# Patient Record
Sex: Female | Born: 1960
Health system: Southern US, Community
[De-identification: ages and names within clinical notes are randomized; demographics above are authoritative.]

## PROBLEM LIST (undated history)

## (undated) DIAGNOSIS — Z8639 Personal history of other endocrine, nutritional and metabolic disease: Secondary | ICD-10-CM

## (undated) DIAGNOSIS — J343 Hypertrophy of nasal turbinates: Secondary | ICD-10-CM

## (undated) DIAGNOSIS — T4145XA Adverse effect of unspecified anesthetic, initial encounter: Secondary | ICD-10-CM

## (undated) DIAGNOSIS — J342 Deviated nasal septum: Secondary | ICD-10-CM

## (undated) DIAGNOSIS — T8859XA Other complications of anesthesia, initial encounter: Secondary | ICD-10-CM

## (undated) HISTORY — PX: ENDOMETRIAL ABLATION: SHX621

## (undated) HISTORY — PX: TUBAL LIGATION: SHX77

## (undated) HISTORY — PX: PILONIDAL CYST EXCISION: SHX744

---

## 2008-10-06 ENCOUNTER — Ambulatory Visit: Payer: Self-pay | Admitting: Cardiology

## 2010-07-13 ENCOUNTER — Ambulatory Visit: Payer: Self-pay | Admitting: Gastroenterology

## 2010-07-13 DIAGNOSIS — K828 Other specified diseases of gallbladder: Secondary | ICD-10-CM | POA: Insufficient documentation

## 2010-07-13 DIAGNOSIS — R109 Unspecified abdominal pain: Secondary | ICD-10-CM | POA: Insufficient documentation

## 2010-07-16 LAB — CONVERTED CEMR LAB: Tissue Transglutaminase Ab, IgA: 10.4 units (ref <20)

## 2010-09-02 ENCOUNTER — Encounter: Admission: RE | Admit: 2010-09-02 | Discharge: 2010-09-02 | Payer: Self-pay | Admitting: Obstetrics and Gynecology

## 2010-09-22 ENCOUNTER — Ambulatory Visit (HOSPITAL_COMMUNITY): Admission: RE | Admit: 2010-09-22 | Discharge: 2010-09-22 | Payer: Self-pay | Admitting: General Surgery

## 2010-09-29 ENCOUNTER — Encounter (INDEPENDENT_AMBULATORY_CARE_PROVIDER_SITE_OTHER): Payer: Self-pay | Admitting: *Deleted

## 2010-12-18 ENCOUNTER — Encounter: Payer: Self-pay | Admitting: General Surgery

## 2010-12-28 NOTE — Letter (Signed)
Summary: Recall Office Visit  [facility]  [address]   [place], [place] [address]   Phone: [phone]  Fax: [phone]      September 29, 2010   Amy Irwin [address] [place], [place]  [address] [birth year]   Dear [name],   According to our records, it is time for you to schedule a follow-up office visit with [place].   At your convenience, please call [phone] to schedule an office visit. If you have any questions, concerns, or feel that this letter is in error, we would appreciate your call.   Sincerely,    Diana Eves  [facility] Ph: [phone]   Fax: [phone]

## 2010-12-30 NOTE — Assessment & Plan Note (Signed)
Summary: ABD PAIN, GB EF 10%   Visit Type:  Initial Consult Referring Amy Irwin:  Roma Kayser, NP Primary Care Rashell Shambaugh:  Donzetta Sprung, M.D.  Chief Complaint:  nausea/abd pain.  History of Present Illness: Side hurts all the time on LUQ/LLQ. Dull and sometimes sharp and won't go away. Off and on. precipitating factors: ???. Under a lot of stress. No changes in stool associated with pain. If "empty colon and bladder it's better". Mild nausea. No problems swallowing, constipation, weight loss, blood in stool or black stool. Diarrhea with fresh fruit esp. Feels bloated. Appetite fine. Pain can keep her awake. Dicyclomine helps. On Cymbalta for 2-3 years. tried to stop it but was on edge. TCS: 2006-[facility]. Rare cheese, often yogurt, milk with cereal every AM, and frequent ice cream. Uses Tylenol. No ASA, BC, Goodys, or Ibuprofen, Motrin, or Aleve.  Preventive Screening-Counseling & Management  Alcohol-Tobacco     Smoking Status: quit  Current Medications (verified): 1)  Glucosamine .... Take 1 Tablet By Mouth Once A Day 2)  Fish Oil 1000mg  .... Take 1 Tablet By Mouth Two Times A Day 3)  Astelin 137 Mcg/spray Soln (Azelastine Hcl) .... As Directed 4)  Allegra 60 Mg Tabs (Fexofenadine Hcl) .... Take 1 Tablet By Mouth Once A Day 5)  Saline Nasal Spray .... As Directed 6)  Dicyclomine Hcl 10 Mg Caps (Dicyclomine Hcl) .... Take 1 Tablet By Mouth Three Times A Day 7)  Cymbalta 60 Mg Cpep (Duloxetine Hcl) .... Take 1 Tab By Mouth At Bedtime 8)  Meclizine Hcl 25 Mg Tabs (Meclizine Hcl) .... As Needed  Allergies (verified): No Known Drug Allergies  Past History:  Past Medical History: Depression Allergies IBS '06-lost 11 lbs **2006-TCS: normal, [facility] 50/5  Past Surgical History: Mesh in bladder Tubal Ligation Endometrial Ablation  Family History: No FH of Colon Cancer or polyps Nephew with Crohn's GERD in bro and sis, s/p Nissen  Social History: Occupation: [employer] Divorced 2  kids, youngest 12 yo Patient is a former smoker x14 years. Quit in 1990. Alcohol Use - no Smoking Status:  quit  Review of Systems       Per HPI otherwise all systems negative.  Vital Signs:  Patient profile:   50 year old female Height:      61.5 inches Weight:      121 pounds BMI:     22.57 Temp:     98.6 degrees F oral Pulse rate:   80 / minute BP sitting:   100 / 70  (left arm) Cuff size:   regular  Vitals Entered By: Cloria Spring LPN (July 13, 2010 9:10 AM)  Physical Exam  General:  Well developed, well nourished, no acute distress. Head:  Normocephalic and atraumatic. Eyes:  PERRLA, no icterus. Mouth:  No deformity or lesions, dentition normal. Neck:  Supple; no masses. Lungs:  Clear throughout to auscultation. Heart:  Regular rate and rhythm; no murmurs. Abdomen:  Soft, nontender and nondistended. No masses,  or hernias noted. Normal bowel sounds. Extremities:  No edema or deformities noted. Neurologic:  Alert and  oriented x4;  grossly normal neurologically.  Impression & Recommendations:  Problem # 1:  ABDOMINAL PAIN, RECURRENT (ICD-789.00) Avoid dairy. Continue dicyclomine three times a day. OPV in 3 mos. Check TTG IGA. Probiotics daily: Restora  #5 sample pills and Rx given.  Problem # 2:  BILIARY DYSKINESIA (ICD-575.8) Assessment: Comment Only GB EF 10%. Pt Asx. No need for surgery at this time.  CC: PCP  Other Orders: T-Tissue Transglutamase Ab IgA [phone])  Patient Instructions: 1)  Avoid dairy. SEE HO. 2)  Continue dicyclomine three times a day. 3)  Take Probiotics daily. 4)  Continue Cymbalta. 5)  OPV in 3 mos. 6)  The medication list was reviewed and reconciled.  All changed / newly prescribed medications were explained.  A complete medication list was provided to the patient / caregiver.  Appended Document: ABD PAIN, GB EF 10% MAR 2011: 121 LBS  JUNE 2011 [facility]: [facility] GB EF 10%  CT AP w/o-CYST IN SPLEEN & LIVER, NO GALLSTONES, CR 0.68  TBILI 0.4 AST 12 ALT 5 ALK PHOS 49 HB 12.8 WBC 7.1 PLT 305 LIP 32  Appended Document: ABD PAIN, GB EF 10% 3 MONTH F/U APPOINTMENT IS IN THE COMPUTER/LAW  Appended Document: ABD PAIN, GB EF 10% 3 MONTH F/U OV IS IN THE COMPUTER/LAW  Appended Document: Orders Update    Clinical Lists Changes  Orders: Added new Service order of New Patient Level IV (09811) - Signed      Appended Document: ABD PAIN, GB EF 10% NOV 2010 TSH 1.260

## 2011-02-09 LAB — COMPREHENSIVE METABOLIC PANEL
ALT: 11 U/L (ref 0–35)
AST: 16 U/L (ref 0–37)
Alkaline Phosphatase: 55 U/L (ref 39–117)
CO2: 29 mEq/L (ref 19–32)
Calcium: 9.7 mg/dL (ref 8.4–10.5)
GFR calc Af Amer: >60 mL/min (ref >60)
Glucose, Bld: 87 mg/dL (ref 70–99)
Potassium: 4.5 mEq/L (ref 3.5–5.1)
Sodium: 138 mEq/L (ref 135–145)
Total Protein: 6.9 g/dL (ref 6.0–8.3)

## 2011-02-09 LAB — CBC
HCT: 39.6 % (ref 36.0–46.0)
Hemoglobin: 13.1 g/dL (ref 12.0–15.0)
MCHC: 33.1 g/dL (ref 30.0–36.0)
RDW: 12.7 % (ref 11.5–15.5)
WBC: 7.3 10*3/uL (ref 4.0–10.5)

## 2011-02-09 LAB — DIFFERENTIAL
Basophils Relative: 0 % (ref 0–1)
Eosinophils Absolute: 0.1 10*3/uL (ref 0.0–0.7)
Eosinophils Relative: 1 % (ref 0–5)
Lymphs Abs: 1.8 10*3/uL (ref 0.7–4.0)
Monocytes Relative: 8 % (ref 3–12)

## 2012-07-26 ENCOUNTER — Other Ambulatory Visit (HOSPITAL_COMMUNITY): Payer: Self-pay | Admitting: Family Medicine

## 2012-07-26 ENCOUNTER — Encounter (HOSPITAL_COMMUNITY): Payer: Self-pay

## 2012-07-26 ENCOUNTER — Ambulatory Visit (HOSPITAL_COMMUNITY)
Admission: RE | Admit: 2012-07-26 | Discharge: 2012-07-26 | Disposition: A | Discharge status: Home / Self Care | Payer: BC Managed Care – PPO | Source: Ambulatory Visit | Attending: Family Medicine | Admitting: Family Medicine

## 2012-07-26 DIAGNOSIS — R1032 Left lower quadrant pain: Secondary | ICD-10-CM

## 2012-07-26 DIAGNOSIS — R9389 Abnormal findings on diagnostic imaging of other specified body structures: Secondary | ICD-10-CM | POA: Insufficient documentation

## 2012-07-26 DIAGNOSIS — N859 Noninflammatory disorder of uterus, unspecified: Secondary | ICD-10-CM | POA: Insufficient documentation

## 2012-07-26 MED ORDER — IOHEXOL 300 MG/ML  SOLN
100.0000 mL | Freq: Once | INTRAMUSCULAR | Status: AC | PRN
  Administered 2012-07-26: 100 mL via INTRAVENOUS

## 2012-09-26 ENCOUNTER — Other Ambulatory Visit: Payer: Self-pay | Admitting: Unknown Physician Specialty

## 2012-09-26 DIAGNOSIS — R928 Other abnormal and inconclusive findings on diagnostic imaging of breast: Secondary | ICD-10-CM

## 2012-10-03 ENCOUNTER — Other Ambulatory Visit: Payer: BC Managed Care – PPO

## 2012-10-04 ENCOUNTER — Ambulatory Visit
Admission: RE | Admit: 2012-10-04 | Discharge: 2012-10-04 | Disposition: A | Discharge status: Home / Self Care | Payer: BC Managed Care – PPO | Source: Ambulatory Visit | Attending: Unknown Physician Specialty | Admitting: Unknown Physician Specialty

## 2012-10-04 DIAGNOSIS — R928 Other abnormal and inconclusive findings on diagnostic imaging of breast: Secondary | ICD-10-CM

## 2012-11-28 HISTORY — PX: ABDOMINAL HYSTERECTOMY: SHX81

## 2013-09-09 ENCOUNTER — Other Ambulatory Visit: Payer: Self-pay

## 2013-09-09 DIAGNOSIS — Z803 Family history of malignant neoplasm of breast: Secondary | ICD-10-CM

## 2013-09-09 DIAGNOSIS — Z1231 Encounter for screening mammogram for malignant neoplasm of breast: Secondary | ICD-10-CM

## 2013-10-10 ENCOUNTER — Ambulatory Visit: Payer: BC Managed Care – PPO

## 2013-11-11 ENCOUNTER — Ambulatory Visit
Admission: RE | Admit: 2013-11-11 | Discharge: 2013-11-11 | Disposition: A | Discharge status: Home / Self Care | Payer: BC Managed Care – PPO | Source: Ambulatory Visit

## 2013-11-11 DIAGNOSIS — Z803 Family history of malignant neoplasm of breast: Secondary | ICD-10-CM

## 2013-11-11 DIAGNOSIS — Z1231 Encounter for screening mammogram for malignant neoplasm of breast: Secondary | ICD-10-CM

## 2013-11-12 ENCOUNTER — Other Ambulatory Visit (HOSPITAL_COMMUNITY): Payer: Self-pay | Admitting: Otolaryngology

## 2013-11-12 ENCOUNTER — Ambulatory Visit (HOSPITAL_COMMUNITY)
Admission: RE | Admit: 2013-11-12 | Discharge: 2013-11-12 | Disposition: A | Discharge status: Home / Self Care | Payer: BC Managed Care – PPO | Source: Ambulatory Visit | Attending: Otolaryngology | Admitting: Otolaryngology

## 2013-11-12 ENCOUNTER — Ambulatory Visit (HOSPITAL_COMMUNITY)
Admission: AD | Admit: 2013-11-12 | Discharge: 2013-11-12 | Disposition: A | Discharge status: Home / Self Care | Payer: BC Managed Care – PPO | Attending: Otolaryngology | Admitting: Otolaryngology

## 2013-11-12 DIAGNOSIS — J329 Chronic sinusitis, unspecified: Secondary | ICD-10-CM

## 2014-11-04 ENCOUNTER — Other Ambulatory Visit: Payer: Self-pay

## 2014-11-04 DIAGNOSIS — Z1231 Encounter for screening mammogram for malignant neoplasm of breast: Secondary | ICD-10-CM

## 2014-11-12 ENCOUNTER — Ambulatory Visit
Admission: RE | Admit: 2014-11-12 | Discharge: 2014-11-12 | Disposition: A | Discharge status: Home / Self Care | Payer: 59 | Source: Ambulatory Visit

## 2014-11-12 ENCOUNTER — Encounter (INDEPENDENT_AMBULATORY_CARE_PROVIDER_SITE_OTHER): Payer: Self-pay

## 2014-11-12 DIAGNOSIS — Z1231 Encounter for screening mammogram for malignant neoplasm of breast: Secondary | ICD-10-CM

## 2015-04-16 ENCOUNTER — Ambulatory Visit (INDEPENDENT_AMBULATORY_CARE_PROVIDER_SITE_OTHER): Payer: BLUE CROSS/BLUE SHIELD | Admitting: Otolaryngology

## 2015-04-16 DIAGNOSIS — R040 Epistaxis: Secondary | ICD-10-CM | POA: Diagnosis not present

## 2015-04-16 DIAGNOSIS — J343 Hypertrophy of nasal turbinates: Secondary | ICD-10-CM | POA: Diagnosis not present

## 2015-04-16 DIAGNOSIS — J342 Deviated nasal septum: Secondary | ICD-10-CM | POA: Diagnosis not present

## 2015-05-14 ENCOUNTER — Ambulatory Visit (INDEPENDENT_AMBULATORY_CARE_PROVIDER_SITE_OTHER): Payer: BLUE CROSS/BLUE SHIELD | Admitting: Otolaryngology

## 2015-05-14 DIAGNOSIS — R040 Epistaxis: Secondary | ICD-10-CM | POA: Diagnosis not present

## 2015-08-06 ENCOUNTER — Ambulatory Visit (INDEPENDENT_AMBULATORY_CARE_PROVIDER_SITE_OTHER): Payer: BLUE CROSS/BLUE SHIELD | Admitting: Otolaryngology

## 2015-08-06 DIAGNOSIS — J310 Chronic rhinitis: Secondary | ICD-10-CM

## 2015-08-06 DIAGNOSIS — J342 Deviated nasal septum: Secondary | ICD-10-CM | POA: Diagnosis not present

## 2015-08-06 DIAGNOSIS — J343 Hypertrophy of nasal turbinates: Secondary | ICD-10-CM | POA: Diagnosis not present

## 2015-08-29 DIAGNOSIS — J342 Deviated nasal septum: Secondary | ICD-10-CM

## 2015-08-29 DIAGNOSIS — J343 Hypertrophy of nasal turbinates: Secondary | ICD-10-CM

## 2015-08-29 HISTORY — DX: Deviated nasal septum: J34.2

## 2015-08-29 HISTORY — DX: Hypertrophy of nasal turbinates: J34.3

## 2015-09-10 ENCOUNTER — Ambulatory Visit (INDEPENDENT_AMBULATORY_CARE_PROVIDER_SITE_OTHER): Payer: BLUE CROSS/BLUE SHIELD | Admitting: Otolaryngology

## 2015-09-10 DIAGNOSIS — J342 Deviated nasal septum: Secondary | ICD-10-CM

## 2015-09-10 DIAGNOSIS — J310 Chronic rhinitis: Secondary | ICD-10-CM

## 2015-09-10 DIAGNOSIS — R040 Epistaxis: Secondary | ICD-10-CM | POA: Diagnosis not present

## 2015-09-10 DIAGNOSIS — J343 Hypertrophy of nasal turbinates: Secondary | ICD-10-CM | POA: Diagnosis not present

## 2015-09-21 ENCOUNTER — Other Ambulatory Visit: Payer: Self-pay | Admitting: Otolaryngology

## 2015-09-24 ENCOUNTER — Encounter (HOSPITAL_BASED_OUTPATIENT_CLINIC_OR_DEPARTMENT_OTHER): Payer: Self-pay | Admitting: *Deleted

## 2015-09-29 ENCOUNTER — Ambulatory Visit (HOSPITAL_BASED_OUTPATIENT_CLINIC_OR_DEPARTMENT_OTHER): Payer: BLUE CROSS/BLUE SHIELD | Admitting: Anesthesiology

## 2015-09-29 ENCOUNTER — Encounter (HOSPITAL_BASED_OUTPATIENT_CLINIC_OR_DEPARTMENT_OTHER)
Admission: RE | Disposition: A | Discharge status: Home / Self Care | Payer: Self-pay | Source: Ambulatory Visit | Attending: Otolaryngology

## 2015-09-29 ENCOUNTER — Ambulatory Visit (HOSPITAL_BASED_OUTPATIENT_CLINIC_OR_DEPARTMENT_OTHER)
Admission: RE | Admit: 2015-09-29 | Discharge: 2015-09-29 | Disposition: A | Discharge status: Home / Self Care | Payer: BLUE CROSS/BLUE SHIELD | Source: Ambulatory Visit | Attending: Otolaryngology | Admitting: Otolaryngology

## 2015-09-29 ENCOUNTER — Encounter (HOSPITAL_BASED_OUTPATIENT_CLINIC_OR_DEPARTMENT_OTHER): Payer: Self-pay | Admitting: *Deleted

## 2015-09-29 DIAGNOSIS — R040 Epistaxis: Secondary | ICD-10-CM | POA: Diagnosis not present

## 2015-09-29 DIAGNOSIS — J342 Deviated nasal septum: Secondary | ICD-10-CM

## 2015-09-29 DIAGNOSIS — J343 Hypertrophy of nasal turbinates: Secondary | ICD-10-CM | POA: Insufficient documentation

## 2015-09-29 DIAGNOSIS — J3489 Other specified disorders of nose and nasal sinuses: Secondary | ICD-10-CM | POA: Insufficient documentation

## 2015-09-29 HISTORY — DX: Hypertrophy of nasal turbinates: J34.3

## 2015-09-29 HISTORY — DX: Other complications of anesthesia, initial encounter: T88.59XA

## 2015-09-29 HISTORY — PX: SEPTOPLASTY: SHX2393

## 2015-09-29 HISTORY — PX: TURBINATE REDUCTION: SHX6157

## 2015-09-29 HISTORY — DX: Personal history of other endocrine, nutritional and metabolic disease: Z86.39

## 2015-09-29 HISTORY — DX: Adverse effect of unspecified anesthetic, initial encounter: T41.45XA

## 2015-09-29 HISTORY — DX: Deviated nasal septum: J34.2

## 2015-09-29 SURGERY — SEPTOPLASTY, NOSE
Anesthesia: General

## 2015-09-29 MED ORDER — SUCCINYLCHOLINE CHLORIDE 20 MG/ML IJ SOLN
INTRAMUSCULAR | Status: DC | PRN
  Administered 2015-09-29: 100 mg via INTRAVENOUS

## 2015-09-29 MED ORDER — MIDAZOLAM HCL 2 MG/2ML IJ SOLN
1.0000 mg | INTRAMUSCULAR | Status: DC | PRN
  Administered 2015-09-29: 2 mg via INTRAVENOUS

## 2015-09-29 MED ORDER — OXYMETAZOLINE HCL 0.05 % NA SOLN
NASAL | Status: DC | PRN
  Administered 2015-09-29: 1 via TOPICAL

## 2015-09-29 MED ORDER — LIDOCAINE HCL (CARDIAC) 20 MG/ML IV SOLN
INTRAVENOUS | Status: DC | PRN
  Administered 2015-09-29: 50 mg via INTRAVENOUS

## 2015-09-29 MED ORDER — OXYCODONE HCL 5 MG PO TABS: 5.0000 mg | ORAL_TABLET | Freq: Once | ORAL | Status: DC | PRN

## 2015-09-29 MED ORDER — MIDAZOLAM HCL 2 MG/2ML IJ SOLN
INTRAMUSCULAR | Status: AC
  Filled 2015-09-29: qty 4

## 2015-09-29 MED ORDER — CEFAZOLIN SODIUM-DEXTROSE 2-3 GM-% IV SOLR
INTRAVENOUS | Status: DC | PRN
  Administered 2015-09-29: 2 g via INTRAVENOUS

## 2015-09-29 MED ORDER — OXYCODONE HCL 5 MG/5ML PO SOLN
5.0000 mg | Freq: Once | ORAL | Status: DC | PRN
Start: 2015-09-29 — End: 2015-09-29

## 2015-09-29 MED ORDER — ONDANSETRON HCL 4 MG/2ML IJ SOLN
INTRAMUSCULAR | Status: AC
  Filled 2015-09-29: qty 2

## 2015-09-29 MED ORDER — GLYCOPYRROLATE 0.2 MG/ML IJ SOLN: 0.2000 mg | Freq: Once | INTRAMUSCULAR | Status: DC | PRN

## 2015-09-29 MED ORDER — OXYCODONE-ACETAMINOPHEN 5-325 MG PO TABS: 1.0000 | ORAL_TABLET | ORAL | Status: DC | PRN

## 2015-09-29 MED ORDER — PROPOFOL 10 MG/ML IV BOLUS
INTRAVENOUS | Status: DC | PRN
  Administered 2015-09-29: 200 mg via INTRAVENOUS

## 2015-09-29 MED ORDER — LIDOCAINE HCL (CARDIAC) 20 MG/ML IV SOLN
INTRAVENOUS | Status: AC
  Filled 2015-09-29: qty 5

## 2015-09-29 MED ORDER — CEFAZOLIN SODIUM 1 G IJ SOLR
INTRAMUSCULAR | Status: AC
  Filled 2015-09-29: qty 20

## 2015-09-29 MED ORDER — MUPIROCIN 2 % EX OINT
TOPICAL_OINTMENT | CUTANEOUS | Status: DC | PRN
  Administered 2015-09-29: 1 via NASAL

## 2015-09-29 MED ORDER — ONDANSETRON HCL 4 MG/2ML IJ SOLN
INTRAMUSCULAR | Status: DC | PRN
Start: 2015-09-29 — End: 2015-09-29
  Administered 2015-09-29: 4 mg via INTRAVENOUS

## 2015-09-29 MED ORDER — LIDOCAINE-EPINEPHRINE 1 %-1:100000 IJ SOLN
INTRAMUSCULAR | Status: DC | PRN
  Administered 2015-09-29: 2 mL

## 2015-09-29 MED ORDER — HYDROMORPHONE HCL 1 MG/ML IJ SOLN: 0.2500 mg | INTRAMUSCULAR | Status: DC | PRN

## 2015-09-29 MED ORDER — LACTATED RINGERS IV SOLN
INTRAVENOUS | Status: DC
  Administered 2015-09-29: 10 mL/h via INTRAVENOUS
  Administered 2015-09-29: 13:00:00 via INTRAVENOUS

## 2015-09-29 MED ORDER — SUCCINYLCHOLINE CHLORIDE 20 MG/ML IJ SOLN
INTRAMUSCULAR | Status: AC
  Filled 2015-09-29: qty 1

## 2015-09-29 MED ORDER — DEXAMETHASONE SODIUM PHOSPHATE 10 MG/ML IJ SOLN
INTRAMUSCULAR | Status: AC
  Filled 2015-09-29: qty 1

## 2015-09-29 MED ORDER — DEXAMETHASONE SODIUM PHOSPHATE 4 MG/ML IJ SOLN
INTRAMUSCULAR | Status: DC | PRN
  Administered 2015-09-29: 10 mg via INTRAVENOUS

## 2015-09-29 MED ORDER — MEPERIDINE HCL 25 MG/ML IJ SOLN: 6.2500 mg | INTRAMUSCULAR | Status: DC | PRN

## 2015-09-29 MED ORDER — SCOPOLAMINE 1 MG/3DAYS TD PT72: 1.0000 | MEDICATED_PATCH | Freq: Once | TRANSDERMAL | Status: DC | PRN

## 2015-09-29 MED ORDER — FENTANYL CITRATE (PF) 100 MCG/2ML IJ SOLN
INTRAMUSCULAR | Status: AC
  Filled 2015-09-29: qty 4

## 2015-09-29 MED ORDER — FENTANYL CITRATE (PF) 100 MCG/2ML IJ SOLN
50.0000 ug | INTRAMUSCULAR | Status: DC | PRN
  Administered 2015-09-29: 100 ug via INTRAVENOUS

## 2015-09-29 MED ORDER — AMOXICILLIN 875 MG PO TABS: 875.0000 mg | ORAL_TABLET | Freq: Two times a day (BID) | ORAL | Status: DC

## 2015-09-29 SURGICAL SUPPLY — 36 items
ATTRACTOMAT 16X20 MAGNETIC DRP (DRAPES) IMPLANT
BLADE SURG 15 STRL LF DISP TIS (BLADE) IMPLANT
BLADE SURG 15 STRL SS (BLADE)
CANISTER SUCT 1200ML W/VALVE (MISCELLANEOUS) ×3 IMPLANT
COAGULATOR SUCT 8FR VV (MISCELLANEOUS) ×3 IMPLANT
DECANTER SPIKE VIAL GLASS SM (MISCELLANEOUS) IMPLANT
DRSG NASOPORE 8CM (GAUZE/BANDAGES/DRESSINGS) IMPLANT
DRSG TELFA 3X8 NADH (GAUZE/BANDAGES/DRESSINGS) IMPLANT
ELECT REM PT RETURN 9FT ADLT (ELECTROSURGICAL) ×3
ELECTRODE REM PT RTRN 9FT ADLT (ELECTROSURGICAL) ×2 IMPLANT
GLOVE BIO SURGEON STRL SZ 6.5 (GLOVE) ×1 IMPLANT
GLOVE BIO SURGEON STRL SZ7.5 (GLOVE) ×3 IMPLANT
GLOVE BIOGEL PI IND STRL 7.0 (GLOVE) IMPLANT
GLOVE BIOGEL PI INDICATOR 7.0 (GLOVE) ×1
GOWN STRL REUS W/ TWL LRG LVL3 (GOWN DISPOSABLE) ×4 IMPLANT
GOWN STRL REUS W/TWL LRG LVL3 (GOWN DISPOSABLE) ×6
NDL HYPO 25X1 1.5 SAFETY (NEEDLE) ×2 IMPLANT
NEEDLE HYPO 25X1 1.5 SAFETY (NEEDLE) ×3 IMPLANT
NS IRRIG 1000ML POUR BTL (IV SOLUTION) ×3 IMPLANT
PACK BASIN DAY SURGERY FS (CUSTOM PROCEDURE TRAY) ×3 IMPLANT
PACK ENT DAY SURGERY (CUSTOM PROCEDURE TRAY) ×3 IMPLANT
PAD DRESSING TELFA 3X8 NADH (GAUZE/BANDAGES/DRESSINGS) IMPLANT
PATTIES SURGICAL .5 X3 (DISPOSABLE) IMPLANT
SLEEVE SCD COMPRESS KNEE MED (MISCELLANEOUS) ×3 IMPLANT
SOLUTION BUTLER CLEAR DIP (MISCELLANEOUS) ×3 IMPLANT
SPLINT NASAL AIRWAY SILICONE (MISCELLANEOUS) ×3 IMPLANT
SPONGE GAUZE 2X2 8PLY STRL LF (GAUZE/BANDAGES/DRESSINGS) ×3 IMPLANT
SPONGE NEURO XRAY DETECT 1X3 (DISPOSABLE) ×3 IMPLANT
SUT CHROMIC 4 0 P 3 18 (SUTURE) ×3 IMPLANT
SUT PLAIN 4 0 ~~LOC~~ 1 (SUTURE) ×3 IMPLANT
SUT PROLENE 3 0 PS 2 (SUTURE) ×3 IMPLANT
SUT VIC AB 4-0 P-3 18XBRD (SUTURE) IMPLANT
SUT VIC AB 4-0 P3 18 (SUTURE)
TOWEL OR 17X24 6PK STRL BLUE (TOWEL DISPOSABLE) ×4 IMPLANT
TUBE SALEM SUMP 16 FR W/ARV (TUBING) IMPLANT
YANKAUER SUCT BULB TIP NO VENT (SUCTIONS) IMPLANT

## 2015-09-29 NOTE — Anesthesia Preprocedure Evaluation (Signed)
Anesthesia Evaluation  Patient identified by MRN, date of birth, ID band Patient awake    Reviewed: Allergy & Precautions, NPO status , Patient's Chart, lab work & pertinent test results  History of Anesthesia Complications (+) PONV  Airway Mallampati: I  TM Distance: >3 FB Neck ROM: Full    Dental  (+) Teeth Intact, Dental Advisory Given   Pulmonary    breath sounds clear to auscultation       Cardiovascular  Rhythm:Regular Rate:Normal     Neuro/Psych    GI/Hepatic   Endo/Other    Renal/GU      Musculoskeletal   Abdominal   Peds  Hematology   Anesthesia Other Findings   Reproductive/Obstetrics                             Anesthesia Physical Anesthesia Plan  ASA: I  Anesthesia Plan: General   Post-op Pain Management:    Induction: Intravenous  Airway Management Planned: Oral ETT  Additional Equipment:   Intra-op Plan:   Post-operative Plan: Extubation in OR  Informed Consent: I have reviewed the patients History and Physical, chart, labs and discussed the procedure including the risks, benefits and alternatives for the proposed anesthesia with the patient or authorized representative who has indicated his/her understanding and acceptance.   Dental advisory given  Plan Discussed with: CRNA, Anesthesiologist and Surgeon  Anesthesia Plan Comments:         Anesthesia Quick Evaluation

## 2015-09-29 NOTE — Op Note (Signed)
DATE OF PROCEDURE: 09/29/2015  OPERATIVE REPORT   SURGEON: Leta Baptist, MD   PREOPERATIVE DIAGNOSES:  1. Severe nasal septal deviation.  2. Bilateral inferior turbinate hypertrophy.  3. Chronic nasal obstruction.  POSTOPERATIVE DIAGNOSES:  1. Severe nasal septal deviation.  2. Bilateral inferior turbinate hypertrophy.  3. Chronic nasal obstruction.  PROCEDURE PERFORMED:  1. Septoplasty.  2. Bilateral partial inferior turbinate resection.   ANESTHESIA: General endotracheal tube anesthesia.   COMPLICATIONS: None.   ESTIMATED BLOOD LOSS: Less than 50 mL.   INDICATION FOR PROCEDURE: CHIRSTINA Irwin is a 54 y.o. female with a history of chronic nasal obstruction. The patient was  treated with decongestant, steroid nasal spray, and systemic steroids. However, the patient continues to be symptomatic. On examination, the patient was noted to have bilateral severe inferior turbinate hypertrophy and significant nasal septal deviation, causing significant nasal obstruction. Based on the above findings, the decision was made for the patient to undergo the above-stated procedure. The risks, benefits, alternatives, and details of the procedure were discussed with the patient. Questions were invited and answered. Informed consent was obtained.   DESCRIPTION OF PROCEDURE: The patient was taken to the operating room and placed supine on the operating table. General endotracheal tube anesthesia was administered by the anesthesiologist. The patient was positioned, and prepped and draped in the standard fashion for nasal surgery. Pledgets soaked with Afrin were placed in both nasal cavities for decongestion. The pledgets were subsequently removed. The above mentioned severe septal deviation was again noted. 1% lidocaine with 1:100,000 epinephrine was injected onto the nasal septum bilaterally. A hemitransfixion incision was made on the left side. The mucosal flap was carefully elevated on the left side. A  cartilaginous incision was made 1 cm superior to the caudal margin of the nasal septum. Mucosal flap was also elevated on the right side in the similar fashion. It should be noted that due to the severe septal deviation, the deviated portion of the cartilaginous and bony septum had to be removed in piecemeal fashion. Once the deviated portions were removed, a straight midline septum was achieved. The septum was then quilted with 4-0 plain gut sutures. The hemitransfixion incision was closed with interrupted 4-0 chromic sutures. Doyle splints were applied.   Prior to the [facility] splint application, the inferior one half of both hypertrophied inferior turbinate was crossclamped with a Kelly clamp. The inferior one half of each inferior turbinate was then resected with a pair of cross cutting scissors. Hemostasis was achieved with a suction cautery device.   The care of the patient was turned over to the anesthesiologist. The patient was awakened from anesthesia without difficulty. The patient was extubated and transferred to the recovery room in good condition.   OPERATIVE FINDINGS: Severe nasal septal deviation and bilateral inferior turbinate hypertrophy.   SPECIMEN: None.   FOLLOWUP CARE: The patient be discharged home once she is awake and alert. The patient will be placed on Percocet 1-2 tablets p.o. q.6 hours p.r.n. pain, and amoxicillin 875 mg p.o. b.i.d. for 5 days. The patient will follow up in my office in approximately 1 week for splint removal.   Council Munguia Raynelle Bring, MD

## 2015-09-29 NOTE — Discharge Instructions (Addendum)

## 2015-09-29 NOTE — Transfer of Care (Signed)
Immediate Anesthesia Transfer of Care Note  Patient: Amy Irwin  Procedure(s) Performed: Procedure(s): SEPTOPLASTY (N/A) BILATERAL TURBINATE REDUCTION (Bilateral)  Patient Location: PACU  Anesthesia Type:General  Level of Consciousness: awake  Airway & Oxygen Therapy: Patient Spontanous Breathing and Patient connected to face mask oxygen  Post-op Assessment: Report given to RN and Post -op Vital signs reviewed and stable  Post vital signs: Reviewed and stable  Last Vitals:  Filed Vitals:   09/29/15 0941  BP: 111/70  Pulse: 74  Temp: 36.7 C  Resp: 20    Complications: No apparent anesthesia complications

## 2015-09-29 NOTE — H&P (Signed)
Cc: Chronic nasal obstruction  HPI: The patient is a 54 y/o female who returns today for follow evaluation of recurrent left epistaxis and chronic congestion.  She was last seen 1 month ago. At that time, she was noted to have persistent nasal congestion, septal deviation, and bilateral inferior turbinate hypertrophy. The patient returns today noting a few episodes of mild epistaxis. She continues to complain of significant nasal obstruction. No facial pain or pressure is currently noted. She is currently on Qnasl nasal sprays. No other ENT, GI, or respiratory issue noted since the last visit.   Exam: The nasal cavities were decongested and anesthetised with a combination of oxymetazoline and 4% lidocaine solution.  The flexible scope was inserted into the right nasal cavity.  Right NSD. Endoscopy of the inferior and middle meatus was performed.  Edematous mucosa was noted.  No polyp, mass, or lesion was appreciated.  Olfactory cleft was clear.  Nasopharynx was clear.  Turbinates were hypertrophied but without mass.  Incomplete response to decongestion.  The procedure was repeated on the contralateral side with similar findings.  The patient tolerated the procedure well.  Instructions were given to avoid eating or drinking for 2 hours.    Assessment: 1.  The patient is doing well s/p left anterior nasal cautery. No active bleeding is noted today. 2.  Right septal deviation is noted with bilateral inferior turbinate hypertrophy. There is no evidence of acute sinusitis on today's nasal endoscopy exam.  Plan:  1.  Nasal endoscopy findings are reviewed with the patient. 2.  Continue with periodic saline irrigation and Qnasl. The proper technique of applying the nasal spray is discussed with the patient. 3.  The patient may benefit from septoplasty and bilateral turbinate reduction to improve her nasal passageway. The risks, benefits, alternatives, and details of the procedures are reviewed. The patient  would like to proceed with the procedures.

## 2015-09-29 NOTE — Anesthesia Procedure Notes (Signed)
Procedure Name: Intubation Date/Time: 09/29/2015 12:48 PM Performed by: Lieutenant Diego Pre-anesthesia Checklist: Patient identified, Emergency Drugs available, Suction available and Patient being monitored Patient Re-evaluated:Patient Re-evaluated prior to inductionOxygen Delivery Method: Circle System Utilized Preoxygenation: Pre-oxygenation with 100% oxygen Intubation Type: IV induction Ventilation: Mask ventilation without difficulty Laryngoscope Size: Miller and 2 Grade View: Grade I Tube type: Oral Tube size: 7.0 mm Number of attempts: 1 Airway Equipment and Method: Stylet and Oral airway Placement Confirmation: ETT inserted through vocal cords under direct vision,  positive ETCO2 and breath sounds checked- equal and bilateral Secured at: 21 cm Tube secured with: Tape Dental Injury: Teeth and Oropharynx as per pre-operative assessment

## 2015-09-30 ENCOUNTER — Encounter (HOSPITAL_BASED_OUTPATIENT_CLINIC_OR_DEPARTMENT_OTHER): Payer: Self-pay | Admitting: Otolaryngology

## 2015-09-30 NOTE — Anesthesia Postprocedure Evaluation (Signed)
  Anesthesia Post-op Note  Patient: Amy Irwin  Procedure(s) Performed: Procedure(s): SEPTOPLASTY (N/A) BILATERAL TURBINATE REDUCTION (Bilateral)  Patient Location: PACU  Anesthesia Type: General   Level of Consciousness: awake, alert  and oriented  Airway and Oxygen Therapy: Patient Spontanous Breathing  Post-op Pain: none  Post-op Assessment: Post-op Vital signs reviewed  Post-op Vital Signs: Reviewed  Last Vitals:  Filed Vitals:   09/29/15 1515  BP: 139/82  Pulse: 79  Temp: 36.8 C  Resp: 18    Complications: No apparent anesthesia complications

## 2015-10-01 ENCOUNTER — Ambulatory Visit (INDEPENDENT_AMBULATORY_CARE_PROVIDER_SITE_OTHER): Payer: BLUE CROSS/BLUE SHIELD | Admitting: Otolaryngology

## 2015-10-02 ENCOUNTER — Telehealth (HOSPITAL_COMMUNITY): Payer: Self-pay

## 2015-10-02 NOTE — Telephone Encounter (Signed)
11/4 pat has been called 5 times to try and schedule and messages were left and she has never returned our call

## 2015-10-05 ENCOUNTER — Telehealth (HOSPITAL_COMMUNITY): Payer: Self-pay

## 2015-10-07 ENCOUNTER — Other Ambulatory Visit: Payer: Self-pay

## 2015-10-07 DIAGNOSIS — Z1231 Encounter for screening mammogram for malignant neoplasm of breast: Secondary | ICD-10-CM

## 2015-10-15 ENCOUNTER — Ambulatory Visit (INDEPENDENT_AMBULATORY_CARE_PROVIDER_SITE_OTHER): Payer: BLUE CROSS/BLUE SHIELD | Admitting: Otolaryngology

## 2015-10-29 ENCOUNTER — Ambulatory Visit (INDEPENDENT_AMBULATORY_CARE_PROVIDER_SITE_OTHER): Payer: BLUE CROSS/BLUE SHIELD | Admitting: Otolaryngology

## 2015-10-29 ENCOUNTER — Ambulatory Visit (HOSPITAL_COMMUNITY): Payer: BLUE CROSS/BLUE SHIELD | Attending: Family Medicine | Admitting: Occupational Therapy

## 2015-10-29 ENCOUNTER — Encounter (INDEPENDENT_AMBULATORY_CARE_PROVIDER_SITE_OTHER): Payer: Self-pay

## 2015-10-29 ENCOUNTER — Encounter (HOSPITAL_COMMUNITY): Payer: Self-pay | Admitting: Occupational Therapy

## 2015-10-29 DIAGNOSIS — M6289 Other specified disorders of muscle: Secondary | ICD-10-CM

## 2015-10-29 DIAGNOSIS — M629 Disorder of muscle, unspecified: Secondary | ICD-10-CM | POA: Diagnosis present

## 2015-10-29 DIAGNOSIS — M25612 Stiffness of left shoulder, not elsewhere classified: Secondary | ICD-10-CM | POA: Insufficient documentation

## 2015-10-29 DIAGNOSIS — M6281 Muscle weakness (generalized): Secondary | ICD-10-CM | POA: Insufficient documentation

## 2015-10-29 DIAGNOSIS — M25511 Pain in right shoulder: Secondary | ICD-10-CM | POA: Diagnosis not present

## 2015-10-29 NOTE — Patient Instructions (Signed)
  1) Flexion Wall Stretch    Face wall, place affected handon wall in front of you. Slide hand up the wall  and lean body in towards the wall. Hold for 5 seconds. Repeat 10 times. 1-2 times/day.     2) Towel Stretch with Internal Rotation   Gently pull up your affected arm  behind your back with the assist of a towel            3) Corner Stretch    Stand at a corner of a wall, place your arms on the walls with elbows bent. Lean into the corner until a stretch is felt along the front of your chest and/or shoulders. Hold for 5 seconds. Repeat 10X, 1-2 times/day.    4) Posterior Capsule Stretch    Bring the involved arm across chest. Grasp elbow and pull toward chest until you feel a stretch in the back of the upper arm and shoulder. Hold 5 seconds. Repeat 10X. Complete 1-2 times/day.    5) Scapular Retraction    Tuck chin back as you pinch shoulder blades together.  Hold 5 seconds. Repeat 10X. Complete 1-2 times/day.   

## 2015-10-29 NOTE — Therapy (Signed)
[facility] [place], [place], [address] Phone: [phone]   Fax:  [phone]  Occupational Therapy Evaluation  Patient Details  Name: Amy Irwin MRN: [id number] Date of Birth: Mar 04, 1961 Referring Provider: Dr. Gar Ponto  Encounter Date: 10/29/2015      OT End of Session - 10/29/15 1333    Visit Number 1   Number of Visits 4   Date for OT Re-Evaluation 12/28/15  mini reassessment 11/26/15   Authorization Type BC[employer]   Authorization - Visit Number 1   Authorization - Number of Visits 30   OT Start Time [id number]   OT Stop Time 1328   OT Time Calculation (min) 26 min   Activity Tolerance Patient tolerated treatment well   Behavior During Therapy [facility] for tasks assessed/performed      Past Medical History  Diagnosis Date  . History of Graves' disease     no current problem or med.  . Deviated nasal septum 08/2015  . Nasal turbinate hypertrophy 08/2015  . Complication of anesthesia     states is hard to wake up post-op    Past Surgical History  Procedure Laterality Date  . Tubal ligation    . Abdominal hysterectomy  2014    partial  . Endometrial ablation    . Pilonidal cyst excision    . Septoplasty N/A 09/29/2015    Procedure: SEPTOPLASTY;  Surgeon: Leta Baptist, MD;  Location: [place];  Service: ENT;  Laterality: N/A;  . Turbinate reduction Bilateral 09/29/2015    Procedure: BILATERAL TURBINATE REDUCTION;  Surgeon: Leta Baptist, MD;  Location: [place];  Service: ENT;  Laterality: Bilateral;    There were no vitals filed for this visit.  Visit Diagnosis:  Right shoulder pain  Tight fascia  Stiffness of left shoulder joint  Muscle weakness of left upper extremity      Subjective Assessment - 10/29/15 1331    Subjective  S: Sometimes my shoulder hurts and sometimes it doesn't.    Pertinent History Pt is a 54 y/o female experiencing intermittant right shoulder pain since  mid-summer 2016. Pt uses ibuprofen as needed for pain management, has not tried ice or heat. Dr. Gar Ponto referred pt to occupational therapy for evaluation and treatment.    Special Tests FOTO Score: 88/100 (12% impairment)   Patient Stated Goals To decrease the pain   Currently in Pain? Yes   Pain Score 2    Pain Location Shoulder   Pain Orientation Right   Pain Descriptors / Indicators Aching   Pain Type Acute pain           [facility] OT Assessment - 10/29/15 1259    Assessment   Diagnosis Right shoulder pain   Referring Provider Dr. Gar Ponto   Onset Date 05/29/15   Prior Therapy None   Precautions   Precautions None   Restrictions   Weight Bearing Restrictions No   Balance Screen   Has the patient fallen in the past 6 months No   Has the patient had a decrease in activity level because of a fear of falling?  No   Is the patient reluctant to leave their home because of a fear of falling?  No   Prior Function   Level of Independence Independent with basic ADLs   Vocation Part time employment   Vocation Requirements LPN: lifting, reaching, repetitive motions   ADL   ADL comments Pt is having difficulty with  dressing tasks, reaching, and lifting weighted objects when shoulder is painful.     Written Expression   Dominant Hand Right   Cognition   Overall Cognitive Status Within Functional Limits for tasks assessed   ROM / Strength   AROM / PROM / Strength AROM;PROM;Strength   Palpation   Palpation comment Moderate fascial restrictions in right upper arm, deltoid, trapezius, and scapularis regions.    AROM   Overall AROM Comments Assessed in standing, ER/IR adducted   AROM Assessment Site Shoulder   Right/Left Shoulder Right   Right Shoulder Flexion 147 Degrees   Right Shoulder ABduction 160 Degrees   Right Shoulder Internal Rotation 90 Degrees   Right Shoulder External Rotation 90 Degrees   PROM   Overall PROM Comments Assessed in supine, ER/IR adducted   PROM  Assessment Site Shoulder   Right/Left Shoulder Right   Right Shoulder Flexion 176 Degrees   Right Shoulder ABduction 180 Degrees   Right Shoulder Internal Rotation 90 Degrees   Right Shoulder External Rotation 90 Degrees   Strength   Overall Strength Comments Assessed seated, ER/IR adducted   Strength Assessment Site Shoulder   Right/Left Shoulder Right   Right Shoulder Flexion 4+/5   Right Shoulder ABduction 4+/5   Right Shoulder Internal Rotation 4+/5   Right Shoulder External Rotation 4+/5                         OT Education - 10/29/15 1330    Education provided Yes   Education Details shoulder stretches   Person(s) Educated Patient   Methods Explanation;Demonstration;Handout   Comprehension Verbalized understanding;Returned demonstration          OT Short Term Goals - 10/29/15 1337    OT SHORT TERM GOAL #1   Title Pt will be educated on and independent in HEP.    Time 4   Period Weeks   Status New   OT SHORT TERM GOAL #2   Title Pt will return to prior level of functioning and independence in daily and work tasks.    Time 4   Period Weeks   Status New   OT SHORT TERM GOAL #3   Title Pt will decrease pain to 1/10 or less during daily tasks.    Time 4   Period Weeks   Status New   OT SHORT TERM GOAL #4   Title Pt will decrease fascial restrictions to min amounts or less to increase ability to achieve full joint ROM.    Time 4   Period Weeks   Status New   OT SHORT TERM GOAL #5   Title Pt will increase A/ROM to WNL to increase ability to reach into high cabinets during daily and work tasks.    Time 4   Period Weeks   Status New   Additional Short Term Goals   Additional Short Term Goals Yes   OT SHORT TERM GOAL #6   Title Pt will increase RUE strength to 5/5 to increase ability to lift heavy items with minimal pain.    Time 4   Period Weeks   Status New                  Plan - 10/29/15 1334    Clinical Impression Statement  A: Pt is a 54 y/o female presenting with right shoulder pain, increased fascial restrictions, decreased range of motion, and decreased strength in RUE limiting her ability to complete daily and work  tasks. Pt reports she is usually able to complete tasks however it is very difficult due to the pain. Provided pt with shoulder stretches for HEP.    Pt will benefit from skilled therapeutic intervention in order to improve on the following deficits (Retired) Pain;Decreased strength;Impaired UE functional use;Decreased range of motion;Increased fascial restricitons;Impaired flexibility   Rehab Potential Good   OT Frequency 1x / week   OT Duration 4 weeks   OT Treatment/Interventions Self-care/ADL training;Passive range of motion;Patient/family education;Moist Heat;Therapeutic exercise;Manual Therapy;Therapeutic activities   Plan P: Pt will benefit from skilled occupational therapy services to decrease pain and fascial restrictions, increase range of motion and strength, and increase functional use of RUE. Treatment plan: Myofascial release, passive stretching, A/ROM, general RUE strengthening, proximal shoulder stability & strengthening    OT Home Exercise Plan shoulder stretches   Consulted and Agree with Plan of Care Patient        Problem List Patient Active Problem List   Diagnosis Date Noted  . BILIARY DYSKINESIA 07/13/2010  . ABDOMINAL PAIN, RECURRENT 07/13/2010    Guadelupe Sabin, OTR/L  [phone]  10/29/2015, 1:41 PM  [facility] [address] [place], [place], [address] Phone: [phone]   Fax:  [phone]  Name: SHALEKA OFFILL MRN: [id number] Date of Birth: [birth year]

## 2015-11-09 ENCOUNTER — Ambulatory Visit (HOSPITAL_COMMUNITY): Payer: BLUE CROSS/BLUE SHIELD

## 2015-11-09 ENCOUNTER — Encounter (HOSPITAL_COMMUNITY): Payer: Self-pay

## 2015-11-09 DIAGNOSIS — M629 Disorder of muscle, unspecified: Secondary | ICD-10-CM

## 2015-11-09 DIAGNOSIS — M6289 Other specified disorders of muscle: Secondary | ICD-10-CM

## 2015-11-09 DIAGNOSIS — M25612 Stiffness of left shoulder, not elsewhere classified: Secondary | ICD-10-CM

## 2015-11-09 DIAGNOSIS — M25511 Pain in right shoulder: Secondary | ICD-10-CM | POA: Diagnosis not present

## 2015-11-09 DIAGNOSIS — M6281 Muscle weakness (generalized): Secondary | ICD-10-CM

## 2015-11-09 NOTE — Patient Instructions (Signed)

## 2015-11-09 NOTE — Therapy (Signed)
[place], [place], [address] Phone: [phone]   Fax:  [phone]  Occupational Therapy Treatment  Patient Details  Name: Amy Irwin MRN: [id number] Date of Birth: 1961-11-19 Referring Provider: Dr. Gar Ponto  Encounter Date: 11/09/2015      OT End of Session - 11/09/15 1227    Visit Number 2   Number of Visits 4   Date for OT Re-Evaluation 12/28/15  mini reassessment 11/26/15   Authorization Type BCBS   Authorization - Visit Number 2   Authorization - Number of Visits 30   OT Start Time 1104   OT Stop Time 1145   OT Time Calculation (min) 41 min   Activity Tolerance Patient tolerated treatment well   Behavior During Therapy [facility] for tasks assessed/performed      Past Medical History  Diagnosis Date  . History of Graves' disease     no current problem or med.  . Deviated nasal septum 08/2015  . Nasal turbinate hypertrophy 08/2015  . Complication of anesthesia     states is hard to wake up post-op    Past Surgical History  Procedure Laterality Date  . Tubal ligation    . Abdominal hysterectomy  2014    partial  . Endometrial ablation    . Pilonidal cyst excision    . Septoplasty N/A 09/29/2015    Procedure: SEPTOPLASTY;  Surgeon: Leta Baptist, MD;  Location: [facility];  Service: ENT;  Laterality: N/A;  . Turbinate reduction Bilateral 09/29/2015    Procedure: BILATERAL TURBINATE REDUCTION;  Surgeon: Leta Baptist, MD;  Location: [facility];  Service: ENT;  Laterality: Bilateral;    There were no vitals filed for this visit.  Visit Diagnosis:  Stiffness of left shoulder joint  Tight fascia  Muscle weakness of left upper extremity      Subjective Assessment - 11/09/15 1128    Subjective  S: My shoulder has actually gotten better the past few months.    Currently in Pain? No/denies            [facility] OT Assessment - 11/09/15 1129    Assessment   Diagnosis  Right shoulder pain   Precautions   Precautions None                  OT Treatments/Exercises (OP) - 11/09/15 1129    Exercises   Exercises Shoulder   Shoulder Exercises: Supine   Protraction PROM;5 reps;Strengthening;12 reps   Protraction Weight (lbs) 1   Horizontal ABduction PROM;5 reps;Strengthening;12 reps   Horizontal ABduction Weight (lbs) 1   External Rotation PROM;5 reps;Strengthening;12 reps   External Rotation Weight (lbs) 1   Internal Rotation PROM;5 reps;Strengthening;12 reps   Internal Rotation Weight (lbs) 1   Flexion PROM;5 reps;Strengthening;12 reps   Shoulder Flexion Weight (lbs) 1   ABduction PROM;5 reps;Strengthening;12 reps   Shoulder ABduction Weight (lbs) 1   Shoulder Exercises: Prone   Other Prone Exercises [place] exercises; 10X   Shoulder Exercises: Standing   Extension Theraband;12 reps   Theraband Level (Shoulder Extension) Level 3 (Green)   Row [employer] reps   Theraband Level (Shoulder Row) Level 3 (Green)   Retraction Theraband;12 reps   Theraband Level (Shoulder Retraction) Level 3 (Green)   Shoulder Exercises: ROM/Strengthening   Proximal Shoulder Strengthening, Supine 12X no rest break   Manual Therapy   Manual Therapy Myofascial release   Manual therapy comments Manual therapy completed prior to therapy  exercises.   Myofascial Release Myofascial release to right upper arm, scapularis, and trapezius region to decrease fascial restrictions and increase joint mobility in a pain free zone.                 OT Education - 11/09/15 1227    Education provided Yes   Education Details scapular theraband strengthening. Pt was given print out of OT evaluation and goals were reviewed.    Person(s) Educated Patient   Methods Explanation;Demonstration;Handout   Comprehension Verbalized understanding;Returned demonstration          OT Short Term Goals - 11/09/15 1229    OT SHORT TERM GOAL #1   Title Pt will be educated on  and independent in HEP.    Time 4   Period Weeks   Status On-going   OT SHORT TERM GOAL #2   Title Pt will return to prior level of functioning and independence in daily and work tasks.    Time 4   Period Weeks   Status On-going   OT SHORT TERM GOAL #3   Title Pt will decrease pain to 1/10 or less during daily tasks.    Time 4   Period Weeks   Status On-going   OT SHORT TERM GOAL #4   Title Pt will decrease fascial restrictions to min amounts or less to increase ability to achieve full joint ROM.    Time 4   Period Weeks   Status On-going   OT SHORT TERM GOAL #5   Title Pt will increase A/ROM to WNL to increase ability to reach into high cabinets during daily and work tasks.    Time 4   Period Weeks   Status On-going   OT SHORT TERM GOAL #6   Title Pt will increase RUE strength to 5/5 to increase ability to lift heavy items with minimal pain.    Time 4   Period Weeks   Status On-going                  Plan - 11/09/15 1227    Clinical Impression Statement A: Initiatied myofascial release, manual stretching, strengthening, and scapular stability exercises. Patient required VC for form and technique. Minimal pain noted during shoulder flexion supine.   Plan P: Follow up on HEP (scapular theraband exercises) Add theraband strengthening shoulder exercises, UBE bike, and Cybex row and press.        Problem List Patient Active Problem List   Diagnosis Date Noted  . BILIARY DYSKINESIA 07/13/2010  . ABDOMINAL PAIN, RECURRENT 07/13/2010    Ailene Ravel, OTR/L,CBIS  [phone]  11/09/2015, 12:30 PM  [facility] [address] [place], [place], [address] Phone: [phone]   Fax:  [phone]  Name: Amy Irwin MRN: [id number] Date of Birth: [birth year]

## 2015-11-16 ENCOUNTER — Encounter (HOSPITAL_COMMUNITY): Payer: Self-pay | Admitting: Occupational Therapy

## 2015-11-16 ENCOUNTER — Ambulatory Visit
Admission: RE | Admit: 2015-11-16 | Discharge: 2015-11-16 | Disposition: A | Discharge status: Home / Self Care | Payer: BLUE CROSS/BLUE SHIELD | Source: Ambulatory Visit

## 2015-11-16 ENCOUNTER — Ambulatory Visit (HOSPITAL_COMMUNITY): Payer: BLUE CROSS/BLUE SHIELD | Admitting: Occupational Therapy

## 2015-11-16 DIAGNOSIS — M25511 Pain in right shoulder: Secondary | ICD-10-CM | POA: Diagnosis not present

## 2015-11-16 DIAGNOSIS — Z1231 Encounter for screening mammogram for malignant neoplasm of breast: Secondary | ICD-10-CM

## 2015-11-16 DIAGNOSIS — M629 Disorder of muscle, unspecified: Secondary | ICD-10-CM

## 2015-11-16 DIAGNOSIS — M25612 Stiffness of left shoulder, not elsewhere classified: Secondary | ICD-10-CM

## 2015-11-16 DIAGNOSIS — M6289 Other specified disorders of muscle: Secondary | ICD-10-CM

## 2015-11-16 DIAGNOSIS — M6281 Muscle weakness (generalized): Secondary | ICD-10-CM

## 2015-11-16 NOTE — Therapy (Signed)
[place], [place], [address] Phone: [phone]   Fax:  [phone]  Occupational Therapy Treatment  Patient Details  Name: Amy Irwin MRN: [id number] Date of Birth: 16-Nov-1961 Referring Provider: Dr. Gar Ponto  Encounter Date: 11/16/2015      OT End of Session - 11/16/15 1101    Visit Number 3   Number of Visits 4   Date for OT Re-Evaluation 12/28/15  mini reassessment 11/26/15   Authorization Type BCBS   Authorization - Visit Number 3   Authorization - Number of Visits 30   OT Start Time 1021   OT Stop Time 1103   OT Time Calculation (min) 42 min   Activity Tolerance Patient tolerated treatment well   Behavior During Therapy [facility] for tasks assessed/performed      Past Medical History  Diagnosis Date  . History of Graves' disease     no current problem or med.  . Deviated nasal septum 08/2015  . Nasal turbinate hypertrophy 08/2015  . Complication of anesthesia     states is hard to wake up post-op    Past Surgical History  Procedure Laterality Date  . Tubal ligation    . Abdominal hysterectomy  2014    partial  . Endometrial ablation    . Pilonidal cyst excision    . Septoplasty N/A 09/29/2015    Procedure: SEPTOPLASTY;  Surgeon: Leta Baptist, MD;  Location: [facility];  Service: ENT;  Laterality: N/A;  . Turbinate reduction Bilateral 09/29/2015    Procedure: BILATERAL TURBINATE REDUCTION;  Surgeon: Leta Baptist, MD;  Location: [facility];  Service: ENT;  Laterality: Bilateral;    There were no vitals filed for this visit.  Visit Diagnosis:  Stiffness of left shoulder joint  Tight fascia  Muscle weakness of left upper extremity  Right shoulder pain      Subjective Assessment - 11/16/15 1023    Subjective  S: It's been doing good, the exercises from last time went well.    Currently in Pain? Yes   Pain Score 1    Pain Location Shoulder   Pain Orientation  Right   Pain Descriptors / Indicators Aching   Pain Type Acute pain            [facility] OT Assessment - 11/16/15 1021    Assessment   Diagnosis Right shoulder pain   Precautions   Precautions None                  OT Treatments/Exercises (OP) - 11/16/15 1023    Exercises   Exercises Shoulder   Shoulder Exercises: Supine   Protraction PROM;5 reps;Strengthening;12 reps   Protraction Weight (lbs) 1   Horizontal ABduction PROM;5 reps;Strengthening;12 reps   Horizontal ABduction Weight (lbs) 1   External Rotation PROM;5 reps;Strengthening;12 reps   External Rotation Weight (lbs) 1   Internal Rotation PROM;5 reps;Strengthening;12 reps   Internal Rotation Weight (lbs) 1   Flexion PROM;5 reps;Strengthening;12 reps   Shoulder Flexion Weight (lbs) 1   ABduction PROM;5 reps;Strengthening;12 reps   Shoulder ABduction Weight (lbs) 1   Shoulder Exercises: Prone   Other Prone Exercises [place] exercises; 10X 1# weight   Shoulder Exercises: ROM/Strengthening   UBE (Upper Arm Bike) Level 1 3' forward 3' reverse   Cybex Press 2 plate;10 reps   Cybex Row 2 plate;10 reps   Proximal Shoulder Strengthening, Supine 12X each 1# weight no rest break   [employer]  on Wall 1' flexion & abduction   Manual Therapy   Manual Therapy Myofascial release   Manual therapy comments Manual therapy completed prior to therapy exercises.   Myofascial Release Myofascial release to right upper arm, scapularis, and trapezius region to decrease fascial restrictions and increase joint mobility in a pain free zone.                   OT Short Term Goals - 11/09/15 1229    OT SHORT TERM GOAL #1   Title Pt will be educated on and independent in [place].    Time 4   Period Weeks   Status On-going   OT SHORT TERM GOAL #2   Title Pt will return to prior level of functioning and independence in daily and work tasks.    Time 4   Period Weeks   Status On-going   OT SHORT TERM GOAL #3   Title Pt will  decrease pain to 1/10 or less during daily tasks.    Time 4   Period Weeks   Status On-going   OT SHORT TERM GOAL #4   Title Pt will decrease fascial restrictions to min amounts or less to increase ability to achieve full joint ROM.    Time 4   Period Weeks   Status On-going   OT SHORT TERM GOAL #5   Title Pt will increase A/ROM to WNL to increase ability to reach into high cabinets during daily and work tasks.    Time 4   Period Weeks   Status On-going   OT SHORT TERM GOAL #6   Title Pt will increase RUE strength to 5/5 to increase ability to lift heavy items with minimal pain.    Time 4   Period Weeks   Status On-going                  Plan - 11/16/15 1058    Clinical Impression Statement A: Added ball on wall, cybex row & press, UBE this session. Pt reports she has been having very minimal pain and is completing her HEP. No complaints of pain during exercises today. Verbal cuing required during cybex row/press for form and technique. Did not complete shoulder theraband strengthening due to time constraints.    Plan P: Mini-reassessment. Complete shoulder theraband strengthening, add standing shoulder exercises with 1# weight.         Problem List Patient Active Problem List   Diagnosis Date Noted  . BILIARY DYSKINESIA 07/13/2010  . ABDOMINAL PAIN, RECURRENT 07/13/2010    Guadelupe Sabin, OTR/L  [phone]  11/16/2015, 11:51 AM  [facility] [address] [place], [place], [address] Phone: [phone]   Fax:  [phone]  Name: Amy Irwin MRN: [id number] Date of Birth: 04/28/1961

## 2015-11-26 ENCOUNTER — Ambulatory Visit (HOSPITAL_COMMUNITY): Payer: BLUE CROSS/BLUE SHIELD | Admitting: Occupational Therapy

## 2016-01-28 ENCOUNTER — Ambulatory Visit (INDEPENDENT_AMBULATORY_CARE_PROVIDER_SITE_OTHER): Payer: BLUE CROSS/BLUE SHIELD | Admitting: Otolaryngology

## 2016-01-28 DIAGNOSIS — J343 Hypertrophy of nasal turbinates: Secondary | ICD-10-CM | POA: Diagnosis not present

## 2016-01-28 DIAGNOSIS — J310 Chronic rhinitis: Secondary | ICD-10-CM

## 2016-02-26 ENCOUNTER — Encounter (HOSPITAL_COMMUNITY): Payer: Self-pay

## 2016-02-26 NOTE — Therapy (Unsigned)
[place], [place], [address] Phone: [phone]   Fax:  [phone]  Patient Details  Name: Amy Irwin MRN: [id number] Date of Birth: [birth year] Referring Provider:  No ref. provider found  Encounter Date: 02/26/2016 OCCUPATIONAL THERAPY DISCHARGE SUMMARY  Visits from Start of Care: 3   Current functional level related to goals / functional outcomes: OT SHORT TERM GOAL #1    Title Pt will be educated on and independent in HEP.    Time 4   Period Weeks   Status On-going   OT SHORT TERM GOAL #2   Title Pt will return to prior level of functioning and independence in daily and work tasks.    Time 4   Period Weeks   Status On-going   OT SHORT TERM GOAL #3   Title Pt will decrease pain to 1/10 or less during daily tasks.    Time 4   Period Weeks   Status On-going   OT SHORT TERM GOAL #4   Title Pt will decrease fascial restrictions to min amounts or less to increase ability to achieve full joint ROM.    Time 4   Period Weeks   Status On-going   OT SHORT TERM GOAL #5   Title Pt will increase A/ROM to WNL to increase ability to reach into high cabinets during daily and work tasks.    Time 4   Period Weeks   Status On-going   OT SHORT TERM GOAL #6   Title Pt will increase RUE strength to 5/5 to increase ability to lift heavy items with minimal pain.    Time 4   Period Weeks   Status On-going          Pt did not return to therapy after visit on 11/16/15. Discharged due to failure to return. No goals met.    Ailene Ravel, OTR/L,CBIS  [phone]  02/26/2016, 3:11 PM  [facility] [address] [place], [place], [address] Phone: [phone]   Fax:  [phone]

## 2016-08-18 DIAGNOSIS — Z6823 Body mass index (BMI) 23.0-23.9, adult: Secondary | ICD-10-CM | POA: Diagnosis not present

## 2016-08-18 DIAGNOSIS — F329 Major depressive disorder, single episode, unspecified: Secondary | ICD-10-CM | POA: Diagnosis not present

## 2016-08-18 DIAGNOSIS — H9193 Unspecified hearing loss, bilateral: Secondary | ICD-10-CM | POA: Diagnosis not present

## 2016-08-18 DIAGNOSIS — Z0000 Encounter for general adult medical examination without abnormal findings: Secondary | ICD-10-CM | POA: Diagnosis not present

## 2016-09-26 ENCOUNTER — Ambulatory Visit (INDEPENDENT_AMBULATORY_CARE_PROVIDER_SITE_OTHER): Payer: BLUE CROSS/BLUE SHIELD | Admitting: Otolaryngology

## 2016-09-26 DIAGNOSIS — H903 Sensorineural hearing loss, bilateral: Secondary | ICD-10-CM | POA: Diagnosis not present

## 2016-10-17 DIAGNOSIS — J0101 Acute recurrent maxillary sinusitis: Secondary | ICD-10-CM | POA: Diagnosis not present

## 2016-10-17 DIAGNOSIS — R042 Hemoptysis: Secondary | ICD-10-CM | POA: Diagnosis not present

## 2016-10-17 DIAGNOSIS — J029 Acute pharyngitis, unspecified: Secondary | ICD-10-CM | POA: Diagnosis not present

## 2016-12-27 ENCOUNTER — Other Ambulatory Visit: Payer: Self-pay | Admitting: Family Medicine

## 2016-12-27 DIAGNOSIS — Z1231 Encounter for screening mammogram for malignant neoplasm of breast: Secondary | ICD-10-CM

## 2017-01-26 ENCOUNTER — Ambulatory Visit
Admission: RE | Admit: 2017-01-26 | Discharge: 2017-01-26 | Disposition: A | Discharge status: Home / Self Care | Payer: BLUE CROSS/BLUE SHIELD | Source: Ambulatory Visit | Attending: Family Medicine | Admitting: Family Medicine

## 2017-01-26 DIAGNOSIS — Z1231 Encounter for screening mammogram for malignant neoplasm of breast: Secondary | ICD-10-CM

## 2017-02-08 DIAGNOSIS — J019 Acute sinusitis, unspecified: Secondary | ICD-10-CM | POA: Diagnosis not present

## 2017-02-08 DIAGNOSIS — Z6824 Body mass index (BMI) 24.0-24.9, adult: Secondary | ICD-10-CM | POA: Diagnosis not present

## 2017-02-23 ENCOUNTER — Encounter (INDEPENDENT_AMBULATORY_CARE_PROVIDER_SITE_OTHER): Payer: Self-pay | Admitting: *Deleted

## 2017-03-29 ENCOUNTER — Other Ambulatory Visit (INDEPENDENT_AMBULATORY_CARE_PROVIDER_SITE_OTHER): Payer: Self-pay | Admitting: *Deleted

## 2017-03-29 ENCOUNTER — Encounter (INDEPENDENT_AMBULATORY_CARE_PROVIDER_SITE_OTHER): Payer: Self-pay | Admitting: *Deleted

## 2017-03-29 DIAGNOSIS — Z1211 Encounter for screening for malignant neoplasm of colon: Secondary | ICD-10-CM | POA: Insufficient documentation

## 2017-06-13 ENCOUNTER — Telehealth (INDEPENDENT_AMBULATORY_CARE_PROVIDER_SITE_OTHER): Payer: Self-pay | Admitting: *Deleted

## 2017-06-13 ENCOUNTER — Encounter (INDEPENDENT_AMBULATORY_CARE_PROVIDER_SITE_OTHER): Payer: Self-pay | Admitting: *Deleted

## 2017-06-13 NOTE — Telephone Encounter (Signed)
Referring MD/PCP: howard   Procedure: tcs  Reason/Indication:  screening  Has patient had this procedure before?  Yes, 10 yrs ago  If so, when, by whom and where?    Is there a family history of colon cancer?  no  Who?  What age when diagnosed?    Is patient diabetic?   no      Does patient have prosthetic heart valve or mechanical valve?  no  Do you have a pacemaker?  no  Has patient ever had endocarditis? no  Has patient had joint replacement within last 12 months?  no  Does patient tend to be constipated or take laxatives? no  Does patient have a history of alcohol/drug use?  no  Is patient on Coumadin, Plavix and/or Aspirin? no  Medications: bupropion 150 mg daily, fish oil 1200 mg bid, fluoxetine 20 mg daily, MVI daily, glucosamine daily  Allergies: nkda  Medication Adjustment per Dr Laural Golden:   Procedure date & time: 07/07/17 at 1015

## 2017-06-13 NOTE — Telephone Encounter (Signed)
Patient needs trilyte 

## 2017-06-14 MED ORDER — PEG 3350-KCL-NA BICARB-NACL 420 G PO SOLR: 4000.0000 mL | Freq: Once | ORAL | 0 refills | Status: AC

## 2017-06-14 NOTE — Telephone Encounter (Signed)
agree

## 2017-06-29 DIAGNOSIS — Z0000 Encounter for general adult medical examination without abnormal findings: Secondary | ICD-10-CM | POA: Diagnosis not present

## 2017-06-29 DIAGNOSIS — Z6823 Body mass index (BMI) 23.0-23.9, adult: Secondary | ICD-10-CM | POA: Diagnosis not present

## 2017-06-29 DIAGNOSIS — F419 Anxiety disorder, unspecified: Secondary | ICD-10-CM | POA: Diagnosis not present

## 2017-10-02 DIAGNOSIS — Z1283 Encounter for screening for malignant neoplasm of skin: Secondary | ICD-10-CM | POA: Diagnosis not present

## 2017-10-02 DIAGNOSIS — D225 Melanocytic nevi of trunk: Secondary | ICD-10-CM | POA: Diagnosis not present

## 2017-10-06 ENCOUNTER — Encounter (INDEPENDENT_AMBULATORY_CARE_PROVIDER_SITE_OTHER): Payer: Self-pay | Admitting: *Deleted

## 2017-10-10 ENCOUNTER — Telehealth (INDEPENDENT_AMBULATORY_CARE_PROVIDER_SITE_OTHER): Payer: Self-pay | Admitting: *Deleted

## 2017-10-10 NOTE — Telephone Encounter (Signed)
Referring MD/PCP: howard   Procedure: tcs  Reason/Indication:  screening  Has patient had this procedure before?  Yes, 10 yrs ago             If so, when, by whom and where?    Is there a family history of colon cancer?  no             Who?  What age when diagnosed?    Is patient diabetic?   no                                                  Does patient have prosthetic heart valve or mechanical valve?  no  Do you have a pacemaker?  no  Has patient ever had endocarditis? no  Has patient had joint replacement within last 12 months?  no  Does patient tend to be constipated or take laxatives? no  Does patient have a history of alcohol/drug use?  no  Is patient on Coumadin, Plavix and/or Aspirin? no  Medications: bupropion 150 mg daily, fish oil 1200 mg bid, fluoxetine 20 mg daily, MVI daily, glucosamine daily  Allergies: nkda  Medication Adjustment per Dr Laural Golden:   Procedure date & time: 11/09/17 at 730

## 2017-10-11 NOTE — Telephone Encounter (Signed)
agree

## 2017-10-13 DIAGNOSIS — B351 Tinea unguium: Secondary | ICD-10-CM | POA: Diagnosis not present

## 2018-01-04 ENCOUNTER — Telehealth (INDEPENDENT_AMBULATORY_CARE_PROVIDER_SITE_OTHER): Payer: Self-pay | Admitting: *Deleted

## 2018-01-04 ENCOUNTER — Encounter (INDEPENDENT_AMBULATORY_CARE_PROVIDER_SITE_OTHER): Payer: Self-pay | Admitting: *Deleted

## 2018-01-04 MED ORDER — PEG 3350-KCL-NA BICARB-NACL 420 G PO SOLR: 4000.0000 mL | Freq: Once | ORAL | 0 refills | Status: AC

## 2018-01-04 NOTE — Telephone Encounter (Signed)
Referring MD/PCP:howard   Procedure:tcs  Reason/Indication:screening  Has patient had this procedure before?Yes, 10 yrs ago If so, when, by whom and where?   Is there a family history of colon cancer?no Who? What age when diagnosed?   Is patient diabetic?no  Does patient have prosthetic heart valve or mechanical valve?no  Do you have a pacemaker?no  Has patient ever had endocarditis?no  Has patient had joint replacement within last 12 months?no  Does patient tend to be constipated or take laxatives?no  Does patient have a history of alcohol/drug use?no  Is patient on Coumadin, Plavix and/or Aspirin?no  Medications:bupropion 150 mg daily, fish oil 1200 mg bid, fluoxetine 20 mg daily, MVI daily, glucosamine daily  Allergies:nkda  Medication Adjustment per Dr Laural Golden:   Procedure date & time: 01/31/18 at 730

## 2018-01-04 NOTE — Telephone Encounter (Signed)
Patient needs trilyte 

## 2018-01-04 NOTE — Telephone Encounter (Signed)
agree

## 2018-01-09 DIAGNOSIS — B351 Tinea unguium: Secondary | ICD-10-CM | POA: Diagnosis not present

## 2018-01-31 ENCOUNTER — Encounter (HOSPITAL_COMMUNITY): Payer: Self-pay

## 2018-01-31 ENCOUNTER — Ambulatory Visit (HOSPITAL_COMMUNITY)
Admission: RE | Admit: 2018-01-31 | Discharge: 2018-01-31 | Disposition: A | Discharge status: Home / Self Care | Payer: BLUE CROSS/BLUE SHIELD | Source: Ambulatory Visit | Attending: Internal Medicine | Admitting: Internal Medicine

## 2018-01-31 ENCOUNTER — Encounter (HOSPITAL_COMMUNITY)
Admission: RE | Disposition: A | Discharge status: Home / Self Care | Payer: Self-pay | Source: Ambulatory Visit | Attending: Internal Medicine

## 2018-01-31 ENCOUNTER — Other Ambulatory Visit: Payer: Self-pay

## 2018-01-31 DIAGNOSIS — Z803 Family history of malignant neoplasm of breast: Secondary | ICD-10-CM | POA: Insufficient documentation

## 2018-01-31 DIAGNOSIS — E0500 Thyrotoxicosis with diffuse goiter without thyrotoxic crisis or storm: Secondary | ICD-10-CM | POA: Insufficient documentation

## 2018-01-31 DIAGNOSIS — Z90710 Acquired absence of both cervix and uterus: Secondary | ICD-10-CM | POA: Insufficient documentation

## 2018-01-31 DIAGNOSIS — K5730 Diverticulosis of large intestine without perforation or abscess without bleeding: Secondary | ICD-10-CM | POA: Diagnosis not present

## 2018-01-31 DIAGNOSIS — K644 Residual hemorrhoidal skin tags: Secondary | ICD-10-CM | POA: Insufficient documentation

## 2018-01-31 DIAGNOSIS — K621 Rectal polyp: Secondary | ICD-10-CM | POA: Insufficient documentation

## 2018-01-31 DIAGNOSIS — Z79899 Other long term (current) drug therapy: Secondary | ICD-10-CM | POA: Diagnosis not present

## 2018-01-31 DIAGNOSIS — D128 Benign neoplasm of rectum: Secondary | ICD-10-CM | POA: Diagnosis not present

## 2018-01-31 DIAGNOSIS — Z1211 Encounter for screening for malignant neoplasm of colon: Secondary | ICD-10-CM | POA: Diagnosis not present

## 2018-01-31 HISTORY — PX: COLONOSCOPY: SHX5424

## 2018-01-31 SURGERY — COLONOSCOPY
Anesthesia: Moderate Sedation

## 2018-01-31 MED ORDER — STERILE WATER FOR IRRIGATION IR SOLN
Status: DC | PRN
  Administered 2018-01-31: 08:00:00

## 2018-01-31 MED ORDER — MEPERIDINE HCL 50 MG/ML IJ SOLN
INTRAMUSCULAR | Status: DC | PRN
  Administered 2018-01-31 (×2): 25 mg

## 2018-01-31 MED ORDER — MIDAZOLAM HCL 5 MG/5ML IJ SOLN
INTRAMUSCULAR | Status: DC | PRN
  Administered 2018-01-31: 1 mg via INTRAVENOUS
  Administered 2018-01-31: 2 mg via INTRAVENOUS
  Administered 2018-01-31: 1 mg via INTRAVENOUS
  Administered 2018-01-31: 2 mg via INTRAVENOUS
  Administered 2018-01-31: 1 mg via INTRAVENOUS

## 2018-01-31 MED ORDER — MEPERIDINE HCL 50 MG/ML IJ SOLN
INTRAMUSCULAR | Status: AC
  Filled 2018-01-31: qty 1

## 2018-01-31 MED ORDER — MIDAZOLAM HCL 5 MG/5ML IJ SOLN
INTRAMUSCULAR | Status: AC
  Filled 2018-01-31: qty 10

## 2018-01-31 MED ORDER — SODIUM CHLORIDE 0.9 % IV SOLN
INTRAVENOUS | Status: DC
  Administered 2018-01-31: 07:00:00 via INTRAVENOUS

## 2018-01-31 NOTE — Discharge Instructions (Signed)
No aspirin or NSAIDs for 5 days. Resume usual medications as before. Resume usual diet. No driving for 24 hours. Physician will call with biopsy results.   Colonoscopy, Adult, Care After This sheet gives you information about how to care for yourself after your procedure. Your doctor may also give you more specific instructions. If you have problems or questions, call your doctor. Follow these instructions at home: General instructions   For the first 24 hours after the procedure: ? Do not drive or use machinery. ? Do not sign important documents. ? Do not drink alcohol. ? Do your daily activities more slowly than normal. ? Eat foods that are soft and easy to digest. ? Rest often.  Take over-the-counter or prescription medicines only as told by your doctor.  It is up to you to get the results of your procedure. Ask your doctor, or the department performing the procedure, when your results will be ready. To help cramping and bloating:  Try walking around.  Put heat on your belly (abdomen) as told by your doctor. Use a heat source that your doctor recommends, such as a moist heat pack or a heating pad. ? Put a towel between your skin and the heat source. ? Leave the heat on for 20-30 minutes. ? Remove the heat if your skin turns bright red. This is especially important if you cannot feel pain, heat, or cold. You can get burned. Eating and drinking  Drink enough fluid to keep your pee (urine) clear or pale yellow.  Return to your normal diet as told by your doctor. Avoid heavy or fried foods that are hard to digest.  Avoid drinking alcohol for as long as told by your doctor. Contact a doctor if:  You have blood in your poop (stool) 2-3 days after the procedure. Get help right away if:  You have more than a small amount of blood in your poop.  You see large clumps of tissue (blood clots) in your poop.  Your belly is swollen.  You feel sick to your stomach  (nauseous).  You throw up (vomit).  You have a fever.  You have belly pain that gets worse, and medicine does not help your pain. This information is not intended to replace advice given to you by your health care provider. Make sure you discuss any questions you have with your health care provider. Document Released: 12/17/2010 Document Revised: 08/08/2016 Document Reviewed: 08/08/2016 Elsevier Interactive Patient Education  2017 [employer]  Hemorrhoids Hemorrhoids are swollen veins in and around the rectum or anus. There are two types of hemorrhoids:  Internal hemorrhoids. These occur in the veins that are just inside the rectum. They may poke through to the outside and become irritated and painful.  External hemorrhoids. These occur in the veins that are outside of the anus and can be felt as a painful swelling or hard lump near the anus.  Most hemorrhoids do not cause serious problems, and they can be managed with home treatments such as diet and lifestyle changes. If home treatments do not help your symptoms, procedures can be done to shrink or remove the hemorrhoids. What are the causes? This condition is caused by increased pressure in the anal area. This pressure may result from various things, including:  Constipation.  Straining to have a bowel movement.  Diarrhea.  Pregnancy.  Obesity.  Sitting for long periods of time.  Heavy lifting or other activity that causes you to strain.  Anal sex.  What  are the signs or symptoms? Symptoms of this condition include:  Pain.  Anal itching or irritation.  Rectal bleeding.  Leakage of stool (feces).  Anal swelling.  One or more lumps around the anus.  How is this diagnosed? This condition can often be diagnosed through a visual exam. Other exams or tests may also be done, such as:  Examination of the rectal area with a gloved hand (digital rectal exam).  Examination of the anal canal using a small tube  (anoscope).  A blood test, if you have lost a significant amount of blood.  A test to look inside the colon (sigmoidoscopy or colonoscopy).  How is this treated? This condition can usually be treated at home. However, various procedures may be done if dietary changes, lifestyle changes, and other home treatments do not help your symptoms. These procedures can help make the hemorrhoids smaller or remove them completely. Some of these procedures involve surgery, and others do not. Common procedures include:  Rubber band ligation. Rubber bands are placed at the base of the hemorrhoids to cut off the blood supply to them.  Sclerotherapy. Medicine is injected into the hemorrhoids to shrink them.  Infrared coagulation. A type of light energy is used to get rid of the hemorrhoids.  Hemorrhoidectomy surgery. The hemorrhoids are surgically removed, and the veins that supply them are tied off.  Stapled hemorrhoidopexy surgery. A circular stapling device is used to remove the hemorrhoids and use staples to cut off the blood supply to them.  Follow these instructions at home: Eating and drinking  Eat foods that have a lot of fiber in them, such as whole grains, beans, nuts, fruits, and vegetables. Ask your health care provider about taking products that have added fiber (fiber supplements).  Drink enough fluid to keep your urine clear or pale yellow. Managing pain and swelling  Take warm sitz baths for 20 minutes, 3-4 times a day to ease pain and discomfort.  If directed, apply ice to the affected area. Using ice packs between sitz baths may be helpful. ? Put ice in a plastic bag. ? Place a towel between your skin and the bag. ? Leave the ice on for 20 minutes, 2-3 times a day. General instructions  Take over-the-counter and prescription medicines only as told by your health care provider.  Use medicated creams or suppositories as told.  Exercise regularly.  Go to the bathroom when you  have the urge to have a bowel movement. Do not wait.  Avoid straining to have bowel movements.  Keep the anal area dry and clean. Use wet toilet paper or moist towelettes after a bowel movement.  Do not sit on the toilet for long periods of time. This increases blood pooling and pain. Contact a health care provider if:  You have increasing pain and swelling that are not controlled by treatment or medicine.  You have uncontrolled bleeding.  You have difficulty having a bowel movement, or you are unable to have a bowel movement.  You have pain or inflammation outside the area of the hemorrhoids. This information is not intended to replace advice given to you by your health care provider. Make sure you discuss any questions you have with your health care provider. Document Released: 11/11/2000 Document Revised: 04/13/2016 Document Reviewed: 07/29/2015 Elsevier Interactive Patient Education  2018 [employer].  Diverticulosis Diverticulosis is a condition that develops when small pouches (diverticula) form in the wall of the large intestine (colon). The colon is where water is  absorbed and stool is formed. The pouches form when the inside layer of the colon pushes through weak spots in the outer layers of the colon. You may have a few pouches or many of them. What are the causes? The cause of this condition is not known. What increases the risk? The following factors may make you more likely to develop this condition:  Being older than age 3. Your risk for this condition increases with age. Diverticulosis is rare among people younger than age 58. By age 7, many people have it.  Eating a low-fiber diet.  Having frequent constipation.  Being overweight.  Not getting enough exercise.  Smoking.  Taking over-the-counter pain medicines, like aspirin and ibuprofen.  Having a family history of diverticulosis.  What are the signs or symptoms? In most people, there are no symptoms of  this condition. If you do have symptoms, they may include:  Bloating.  Cramps in the abdomen.  Constipation or diarrhea.  Pain in the lower left side of the abdomen.  How is this diagnosed? This condition is most often diagnosed during an exam for other colon problems. Because diverticulosis usually has no symptoms, it often cannot be diagnosed independently. This condition may be diagnosed by:  Using a flexible scope to examine the colon (colonoscopy).  Taking an X-ray of the colon after dye has been put into the colon (barium enema).  Doing a CT scan.  How is this treated? You may not need treatment for this condition if you have never developed an infection related to diverticulosis. If you have had an infection before, treatment may include:  Eating a high-fiber diet. This may include eating more fruits, vegetables, and grains.  Taking a fiber supplement.  Taking a live bacteria supplement (probiotic).  Taking medicine to relax your colon.  Taking antibiotic medicines.  Follow these instructions at home:  Drink 6-8 glasses of water or more each day to prevent constipation.  Try not to strain when you have a bowel movement.  If you have had an infection before: ? Eat more fiber as directed by your health care provider or your diet and nutrition specialist (dietitian). ? Take a fiber supplement or probiotic, if your health care provider approves.  Take over-the-counter and prescription medicines only as told by your health care provider.  If you were prescribed an antibiotic, take it as told by your health care provider. Do not stop taking the antibiotic even if you start to feel better.  Keep all follow-up visits as told by your health care provider. This is important. Contact a health care provider if:  You have pain in your abdomen.  You have bloating.  You have cramps.  You have not had a bowel movement in 3 days. Get help right away if:  Your pain gets  worse.  Your bloating becomes very bad.  You have a fever or chills, and your symptoms suddenly get worse.  You vomit.  You have bowel movements that are bloody or black.  You have bleeding from your rectum. Summary  Diverticulosis is a condition that develops when small pouches (diverticula) form in the wall of the large intestine (colon).  You may have a few pouches or many of them.  This condition is most often diagnosed during an exam for other colon problems.  If you have had an infection related to diverticulosis, treatment may include increasing the fiber in your diet, taking supplements, or taking medicines. This information is not intended to replace  advice given to you by your health care provider. Make sure you discuss any questions you have with your health care provider. Document Released: 08/11/2004 Document Revised: 10/03/2016 Document Reviewed: 10/03/2016 Elsevier Interactive Patient Education  2017 [employer].  Colon Polyps Polyps are tissue growths inside the body. Polyps can grow in many places, including the large intestine (colon). A polyp may be a round bump or a mushroom-shaped growth. You could have one polyp or several. Most colon polyps are noncancerous (benign). However, some colon polyps can become cancerous over time. What are the causes? The exact cause of colon polyps is not known. What increases the risk? This condition is more likely to develop in people who:  Have a family history of colon cancer or colon polyps.  Are older than 39 or older than 45 if they are African American.  Have inflammatory bowel disease, such as ulcerative colitis or Crohn disease.  Are overweight.  Smoke cigarettes.  Do not get enough exercise.  Drink too much alcohol.  Eat a diet that is: ? High in fat and red meat. ? Low in fiber.  Had childhood cancer that was treated with abdominal radiation.  What are the signs or symptoms? Most polyps do not cause  symptoms. If you have symptoms, they may include:  Blood coming from your rectum when having a bowel movement.  Blood in your stool.The stool may look dark red or black.  A change in bowel habits, such as constipation or diarrhea.  How is this diagnosed? This condition is diagnosed with a colonoscopy. This is a procedure that uses a lighted, flexible scope to look at the inside of your colon. How is this treated? Treatment for this condition involves removing any polyps that are found. Those polyps will then be tested for cancer. If cancer is found, your health care provider will talk to you about options for colon cancer treatment. Follow these instructions at home: Diet  Eat plenty of fiber, such as fruits, vegetables, and whole grains.  Eat foods that are high in calcium and vitamin D, such as milk, cheese, yogurt, eggs, liver, fish, and broccoli.  Limit foods high in fat, red meats, and processed meats, such as hot dogs, sausage, bacon, and lunch meats.  Maintain a healthy weight, or lose weight if recommended by your health care provider. General instructions  Do not smoke cigarettes.  Do not drink alcohol excessively.  Keep all follow-up visits as told by your health care provider. This is important. This includes keeping regularly scheduled colonoscopies. Talk to your health care provider about when you need a colonoscopy.  Exercise every day or as told by your health care provider. Contact a health care provider if:  You have new or worsening bleeding during a bowel movement.  You have new or increased blood in your stool.  You have a change in bowel habits.  You unexpectedly lose weight. This information is not intended to replace advice given to you by your health care provider. Make sure you discuss any questions you have with your health care provider. Document Released: 08/10/2004 Document Revised: 04/21/2016 Document Reviewed: 10/05/2015 Elsevier Interactive  Patient Education  [employer].

## 2018-01-31 NOTE — Op Note (Signed)
[facility] Patient Name: Amy Irwin Procedure Date: 01/31/2018 7:08 AM MRN: [id number] Date of Birth: 15-Jun-1961 Attending MD: Hildred Laser , MD CSN: [id number] Age: 57 Admit Type: Outpatient Procedure:                Colonoscopy Indications:              Screening for colorectal malignant neoplasm Providers:                Hildred Laser, MD, Janeece Riggers, RN, Randa Spike,                            Technician Referring MD:             Rory Percy, MD Medicines:                Meperidine 50 mg IV, Midazolam 7 mg IV Complications:            No immediate complications. Estimated Blood Loss:     Estimated blood loss: none. Procedure:                Pre-Anesthesia Assessment:                           - Prior to the procedure, a History and Physical                            was performed, and patient medications and                            allergies were reviewed. The patient's tolerance of                            previous anesthesia was also reviewed. The risks                            and benefits of the procedure and the sedation                            options and risks were discussed with the patient.                            All questions were answered, and informed consent                            was obtained. Prior Anticoagulants: The patient has                            taken no previous anticoagulant or antiplatelet                            agents. ASA Grade Assessment: I - A normal, healthy                            patient. After reviewing the risks and benefits,  the patient was deemed in satisfactory condition to                            undergo the procedure.                           After obtaining informed consent, the colonoscope                            was passed under direct vision. Throughout the                            procedure, the patient's blood pressure, pulse, and    oxygen saturations were monitored continuously. The                            EC-[id number] ([id number]) scope was introduced through                            the anus and advanced to the the cecum, identified                            by appendiceal orifice and ileocecal valve. The                            colonoscopy was somewhat difficult due to                            significant looping. Successful completion of the                            procedure was aided by changing the patient to a                            supine position. The ileocecal valve, appendiceal                            orifice, and rectum were photographed. The quality                            of the bowel preparation was excellent. Scope In: 7:34:48 AM Scope Out: 7:58:00 AM Scope Withdrawal Time: 0 hours 11 minutes 27 seconds  Total Procedure Duration: 0 hours 23 minutes 12 seconds  Findings:      The perianal and digital rectal examinations were normal.      A single small-mouthed diverticulum was found in the proximal sigmoid       colon.      A 6 mm polyp was found in the rectum. The polyp was sessile. The polyp       was removed with a hot snare. Resection and retrieval were complete.      External hemorrhoids were found during retroflexion. The hemorrhoids       were small. Impression:               - Diverticulosis in the proximal sigmoid colon.                           -  One 6 mm polyp in the rectum, removed with a hot                            snare. Resected and retrieved.                           - External hemorrhoids. Moderate Sedation:      Moderate (conscious) sedation was administered by the endoscopy nurse       and supervised by the endoscopist. The following parameters were       monitored: oxygen saturation, heart rate, blood pressure, CO2       capnography and response to care. Total physician intraservice time was       28 minutes. Recommendation:           - Patient has a  contact number available for                            emergencies. The signs and symptoms of potential                            delayed complications were discussed with the                            patient. Return to normal activities tomorrow.                            Written discharge instructions were provided to the                            patient.                           - Resume previous diet today.                           - Continue present medications.                           - No aspirin, ibuprofen, naproxen, or other                            non-steroidal anti-inflammatory drugs for 5 days                            after polyp removal.                           - Await pathology results.                           - Repeat colonoscopy is recommended. The                            colonoscopy date will be determined after pathology  results from today's exam become available for                            review. Procedure Code(s):        --- Professional ---                           [phone], Colonoscopy, flexible; with removal of                            tumor(s), polyp(s), or other lesion(s) by snare                            technique                           99152, Moderate sedation services provided by the                            same physician or other qualified health care                            professional performing the diagnostic or                            therapeutic service that the sedation supports,                            requiring the presence of an independent trained                            observer to assist in the monitoring of the                            patient's level of consciousness and physiological                            status; initial 15 minutes of intraservice time,                            patient age 39 years or older                           [phone], Moderate sedation  services; each additional                            15 minutes intraservice time Diagnosis Code(s):        --- Professional ---                           Z12.11, Encounter for screening for malignant                            neoplasm of colon  K64.4, Residual hemorrhoidal skin tags                           K62.1, Rectal polyp                           K57.30, Diverticulosis of large intestine without                            perforation or abscess without bleeding CPT copyright 2016 American Medical Association. All rights reserved. The codes documented in this report are preliminary and upon coder review may  be revised to meet current compliance requirements. Hildred Laser, MD Hildred Laser, MD 01/31/2018 8:07:18 AM This report has been signed electronically. Number of Addenda: 0

## 2018-01-31 NOTE — H&P (Signed)
Amy Irwin is an 57 y.o. female.   Chief Complaint: Patient is here for colonoscopy. HPI: Patient is a 57 year old Caucasian female who is here for screening colonoscopy.  Last exam was normal 10 years ago.  She denies abdominal pain change in bowel habits or rectal bleeding. Family history is negative for CRC.  Past Medical History:  Diagnosis Date  . Complication of anesthesia    states is hard to wake up post-op  . Deviated nasal septum 08/2015  . History of Graves' disease    no current problem or med.  . Nasal turbinate hypertrophy 08/2015    Past Surgical History:  Procedure Laterality Date  . ABDOMINAL HYSTERECTOMY  2014   partial  . ENDOMETRIAL ABLATION    . PILONIDAL CYST EXCISION    . SEPTOPLASTY N/A 09/29/2015   Procedure: SEPTOPLASTY;  Surgeon: Leta Baptist, MD;  Location: [facility];  Service: ENT;  Laterality: N/A;  . TUBAL LIGATION    . TURBINATE REDUCTION Bilateral 09/29/2015   Procedure: BILATERAL TURBINATE REDUCTION;  Surgeon: Leta Baptist, MD;  Location: [place];  Service: ENT;  Laterality: Bilateral;    Family History  Problem Relation Age of Onset  . Breast cancer Mother    Social History:  reports that  has never smoked. she has never used smokeless tobacco. She reports that she does not drink alcohol or use drugs.  Allergies: No Known Allergies  Medications Prior to Admission  Medication Sig Dispense Refill  . Biotin w/ Vitamins C & E (HAIR/SKIN/NAILS PO) Take 1 tablet by mouth daily.    . DULoxetine (CYMBALTA) 60 MG capsule Take 60 mg by mouth at bedtime.    Marland Kitchen glucosamine-chondroitin 500-400 MG tablet Take 1 tablet by mouth daily.    Marland Kitchen ibuprofen (ADVIL,MOTRIN) 200 MG tablet Take 400 mg by mouth every 6 (six) hours as needed for headache or moderate pain.    . Multiple Vitamin (MULTIVITAMIN) tablet Take 1 tablet by mouth daily.    . Omega-3 Fatty Acids (FISH OIL) 1000 MG CAPS Take 1,000 mg by mouth 2 (two) times daily.      . ranitidine (ZANTAC) 150 MG tablet Take 150 mg by mouth 2 (two) times daily as needed for heartburn.    Marland Kitchen oxyCODONE-acetaminophen (ROXICET) 5-325 MG tablet Take 1 tablet by mouth every 4 (four) hours as needed. (Patient not taking: Reported on 01/22/2018) 20 tablet 0    No results found for this or any previous visit (from the past 48 hour(s)). No results found.  ROS  Blood pressure 122/68, pulse 86, temperature 97.9 F (36.6 C), temperature source Oral, resp. rate 19, height 5' 1.5" (1.562 m), weight 126 lb (57.2 kg), SpO2 93 %. Physical Exam  Constitutional: She appears well-developed and well-nourished.  HENT:  Mouth/Throat: Oropharynx is clear and moist.  Eyes: Conjunctivae are normal. No scleral icterus.  Neck: No thyromegaly present.  Cardiovascular: Normal rate, regular rhythm and normal heart sounds.  No murmur heard. Respiratory: Effort normal and breath sounds normal.  GI: Soft. She exhibits no distension and no mass.  Musculoskeletal: She exhibits no edema.  Lymphadenopathy:    She has no cervical adenopathy.  Neurological: She is alert.  Skin: Skin is warm and dry.     Assessment/Plan Average for screening colonoscopy.  Amy Laser, MD 01/31/2018, 7:26 AM

## 2018-02-02 ENCOUNTER — Encounter (HOSPITAL_COMMUNITY): Payer: Self-pay | Admitting: Internal Medicine

## 2018-03-01 DIAGNOSIS — Z1331 Encounter for screening for depression: Secondary | ICD-10-CM | POA: Diagnosis not present

## 2018-03-01 DIAGNOSIS — Z1389 Encounter for screening for other disorder: Secondary | ICD-10-CM | POA: Diagnosis not present

## 2018-03-01 DIAGNOSIS — E782 Mixed hyperlipidemia: Secondary | ICD-10-CM | POA: Diagnosis not present

## 2018-03-01 DIAGNOSIS — F419 Anxiety disorder, unspecified: Secondary | ICD-10-CM | POA: Diagnosis not present

## 2018-03-01 DIAGNOSIS — Z6824 Body mass index (BMI) 24.0-24.9, adult: Secondary | ICD-10-CM | POA: Diagnosis not present

## 2018-03-01 DIAGNOSIS — R5383 Other fatigue: Secondary | ICD-10-CM | POA: Diagnosis not present

## 2018-03-26 DIAGNOSIS — Z6824 Body mass index (BMI) 24.0-24.9, adult: Secondary | ICD-10-CM | POA: Diagnosis not present

## 2018-03-26 DIAGNOSIS — D485 Neoplasm of uncertain behavior of skin: Secondary | ICD-10-CM | POA: Diagnosis not present

## 2018-05-16 ENCOUNTER — Other Ambulatory Visit: Payer: Self-pay | Admitting: Family Medicine

## 2018-05-16 DIAGNOSIS — Z1231 Encounter for screening mammogram for malignant neoplasm of breast: Secondary | ICD-10-CM

## 2018-06-07 ENCOUNTER — Ambulatory Visit
Admission: RE | Admit: 2018-06-07 | Discharge: 2018-06-07 | Disposition: A | Discharge status: Home / Self Care | Payer: BLUE CROSS/BLUE SHIELD | Source: Ambulatory Visit | Attending: Family Medicine | Admitting: Family Medicine

## 2018-06-07 DIAGNOSIS — Z1231 Encounter for screening mammogram for malignant neoplasm of breast: Secondary | ICD-10-CM

## 2018-07-05 DIAGNOSIS — R202 Paresthesia of skin: Secondary | ICD-10-CM | POA: Diagnosis not present

## 2018-07-05 DIAGNOSIS — E782 Mixed hyperlipidemia: Secondary | ICD-10-CM | POA: Diagnosis not present

## 2018-07-05 DIAGNOSIS — F419 Anxiety disorder, unspecified: Secondary | ICD-10-CM | POA: Diagnosis not present

## 2018-07-05 DIAGNOSIS — Z6824 Body mass index (BMI) 24.0-24.9, adult: Secondary | ICD-10-CM | POA: Diagnosis not present

## 2018-12-31 DIAGNOSIS — R202 Paresthesia of skin: Secondary | ICD-10-CM | POA: Diagnosis not present

## 2018-12-31 DIAGNOSIS — F419 Anxiety disorder, unspecified: Secondary | ICD-10-CM | POA: Diagnosis not present

## 2018-12-31 DIAGNOSIS — E782 Mixed hyperlipidemia: Secondary | ICD-10-CM | POA: Diagnosis not present

## 2018-12-31 DIAGNOSIS — J019 Acute sinusitis, unspecified: Secondary | ICD-10-CM | POA: Diagnosis not present

## 2019-01-07 DIAGNOSIS — F304 Manic episode in full remission: Secondary | ICD-10-CM | POA: Diagnosis not present

## 2019-01-21 DIAGNOSIS — F304 Manic episode in full remission: Secondary | ICD-10-CM | POA: Diagnosis not present

## 2019-02-04 DIAGNOSIS — F304 Manic episode in full remission: Secondary | ICD-10-CM | POA: Diagnosis not present

## 2019-02-26 DIAGNOSIS — R0781 Pleurodynia: Secondary | ICD-10-CM | POA: Diagnosis not present

## 2019-02-26 DIAGNOSIS — Z6824 Body mass index (BMI) 24.0-24.9, adult: Secondary | ICD-10-CM | POA: Diagnosis not present

## 2019-03-11 DIAGNOSIS — F304 Manic episode in full remission: Secondary | ICD-10-CM | POA: Diagnosis not present

## 2019-04-04 DIAGNOSIS — F304 Manic episode in full remission: Secondary | ICD-10-CM | POA: Diagnosis not present

## 2019-04-18 DIAGNOSIS — F304 Manic episode in full remission: Secondary | ICD-10-CM | POA: Diagnosis not present

## 2019-05-02 DIAGNOSIS — F304 Manic episode in full remission: Secondary | ICD-10-CM | POA: Diagnosis not present

## 2019-05-16 DIAGNOSIS — F304 Manic episode in full remission: Secondary | ICD-10-CM | POA: Diagnosis not present

## 2019-05-21 ENCOUNTER — Other Ambulatory Visit: Payer: Self-pay | Admitting: Family Medicine

## 2019-05-21 DIAGNOSIS — Z1231 Encounter for screening mammogram for malignant neoplasm of breast: Secondary | ICD-10-CM

## 2019-05-23 DIAGNOSIS — D1801 Hemangioma of skin and subcutaneous tissue: Secondary | ICD-10-CM | POA: Diagnosis not present

## 2019-05-23 DIAGNOSIS — B351 Tinea unguium: Secondary | ICD-10-CM | POA: Diagnosis not present

## 2019-05-23 DIAGNOSIS — L918 Other hypertrophic disorders of the skin: Secondary | ICD-10-CM | POA: Diagnosis not present

## 2019-05-23 DIAGNOSIS — L820 Inflamed seborrheic keratosis: Secondary | ICD-10-CM | POA: Diagnosis not present

## 2019-06-06 DIAGNOSIS — F304 Manic episode in full remission: Secondary | ICD-10-CM | POA: Diagnosis not present

## 2019-06-10 ENCOUNTER — Ambulatory Visit
Admission: RE | Admit: 2019-06-10 | Discharge: 2019-06-10 | Disposition: A | Discharge status: Home / Self Care | Payer: BC Managed Care – PPO | Source: Ambulatory Visit | Attending: Family Medicine | Admitting: Family Medicine

## 2019-06-10 ENCOUNTER — Other Ambulatory Visit: Payer: Self-pay

## 2019-06-10 DIAGNOSIS — Z1231 Encounter for screening mammogram for malignant neoplasm of breast: Secondary | ICD-10-CM | POA: Diagnosis not present

## 2019-07-01 DIAGNOSIS — F304 Manic episode in full remission: Secondary | ICD-10-CM | POA: Diagnosis not present

## 2019-07-18 DIAGNOSIS — F304 Manic episode in full remission: Secondary | ICD-10-CM | POA: Diagnosis not present

## 2019-07-31 IMAGING — MG DIGITAL SCREENING BILATERAL MAMMOGRAM WITH TOMO AND CAD
8 series · 9 of 24 positions shown · non-contrast
Comparison: Previous exam(s).

CLINICAL DATA: Screening.

EXAM:
DIGITAL SCREENING BILATERAL MAMMOGRAM WITH TOMO AND CAD

[L MLO synth-2D]
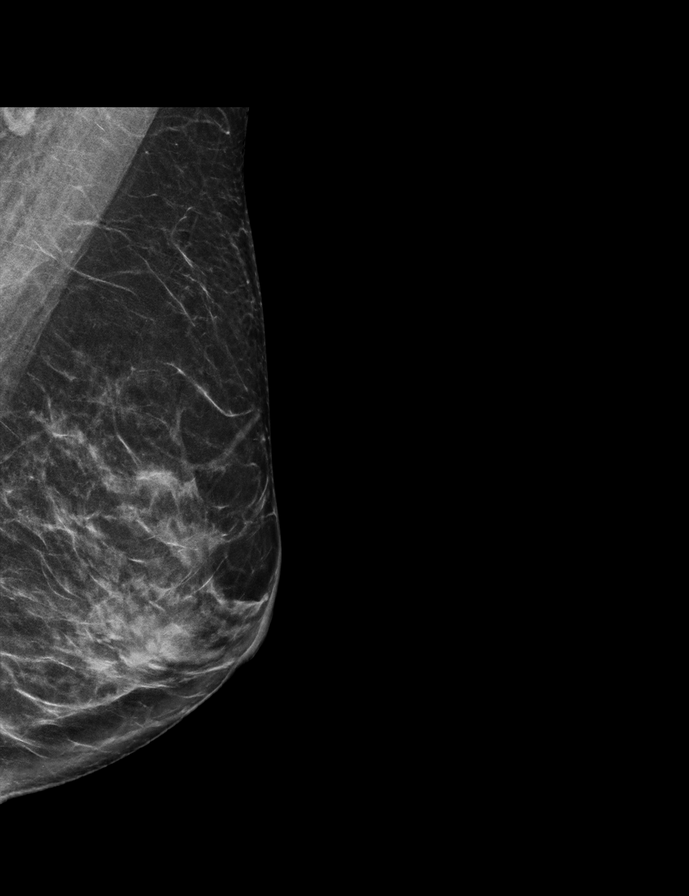

[R MLO synth-2D]
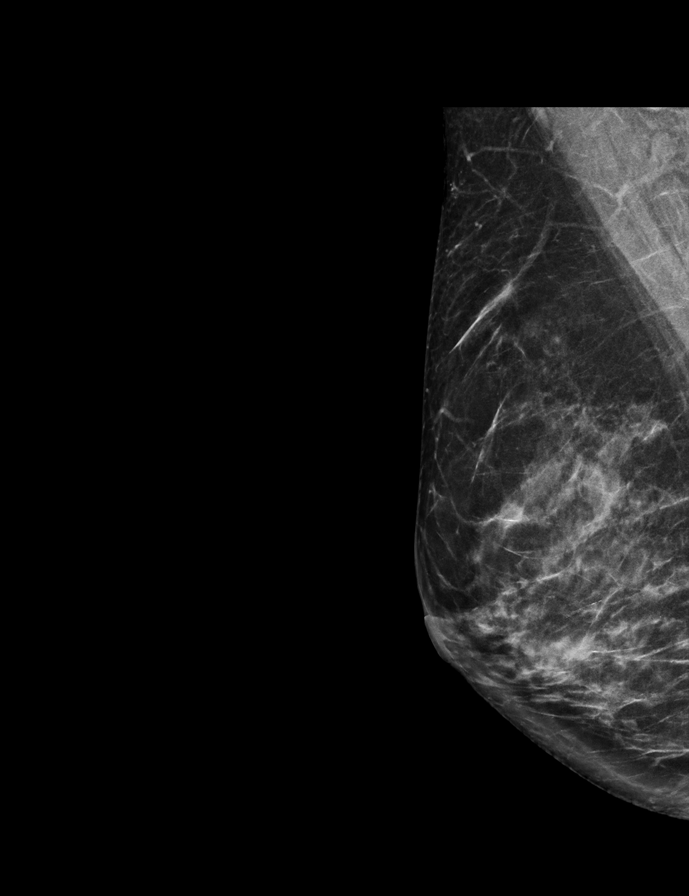

[R CC synth-2D]
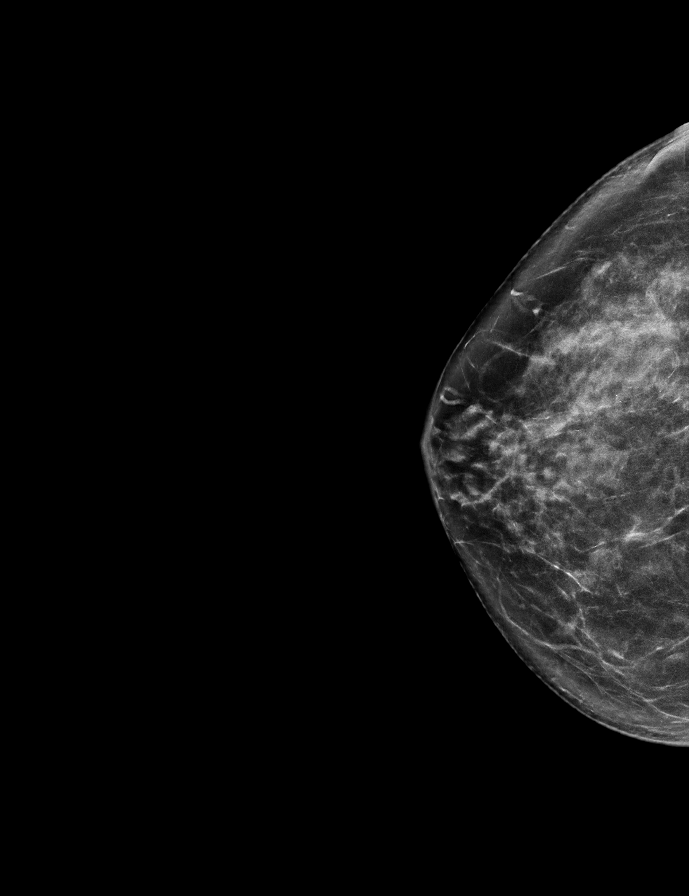

[L CC synth-2D]
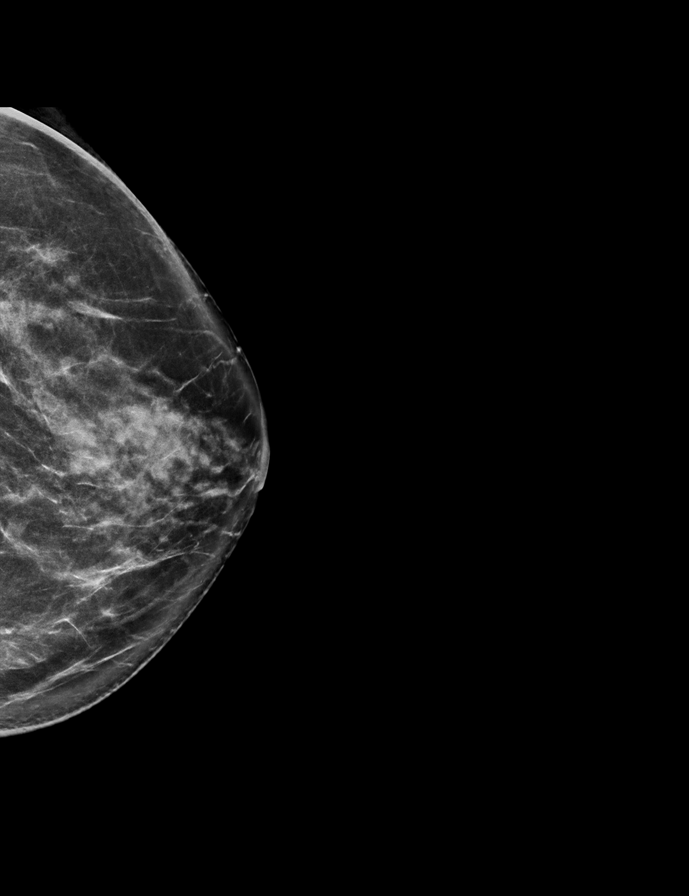

[L CC tomo · 2 of 70 frames shown]
[frame 23/70]
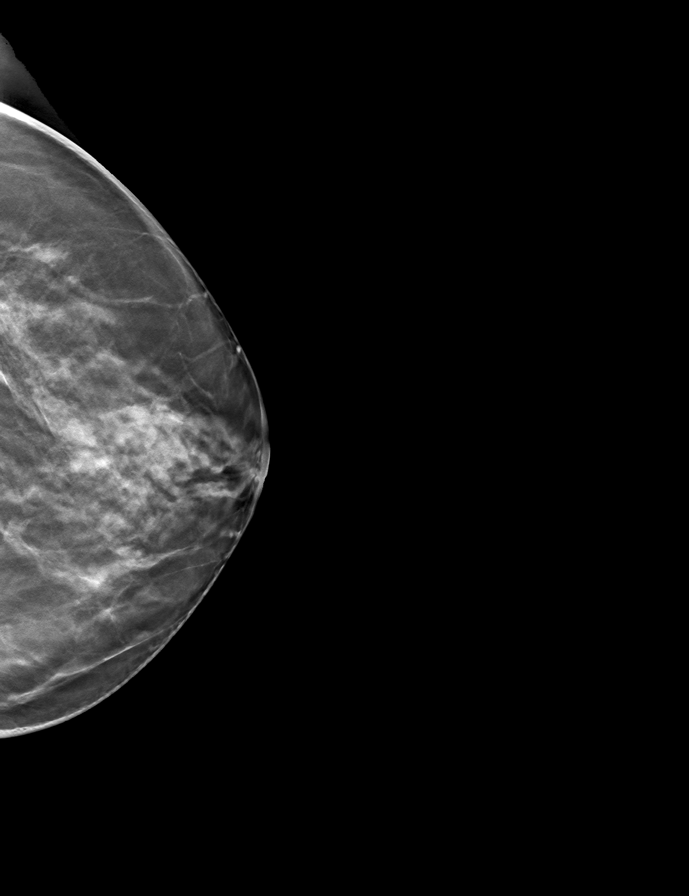
[frame 35/70]
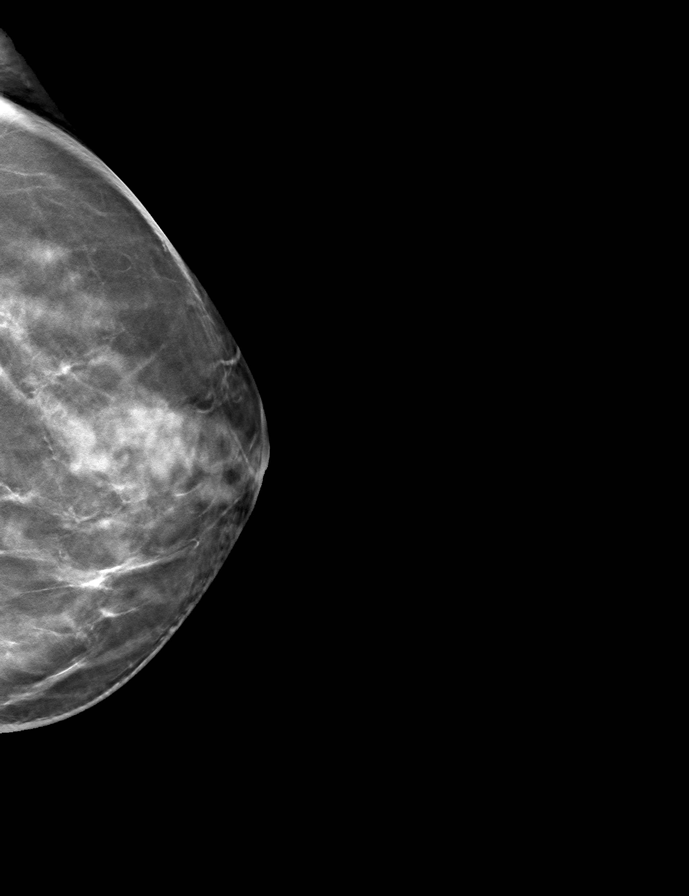

[R CC tomo · tomo slice 33/66.0]
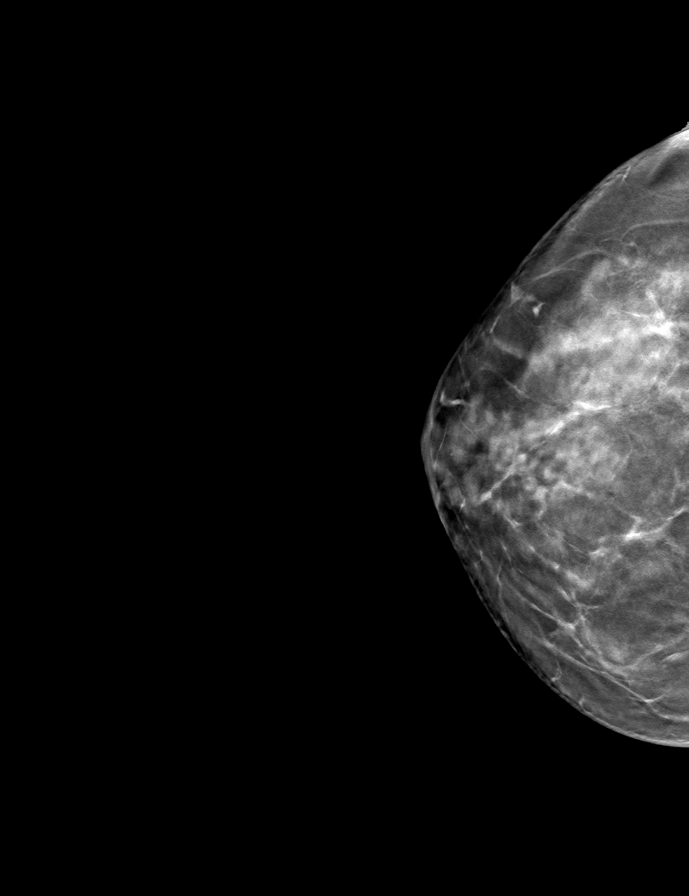

[R MLO tomo · tomo slice 32/63.0]
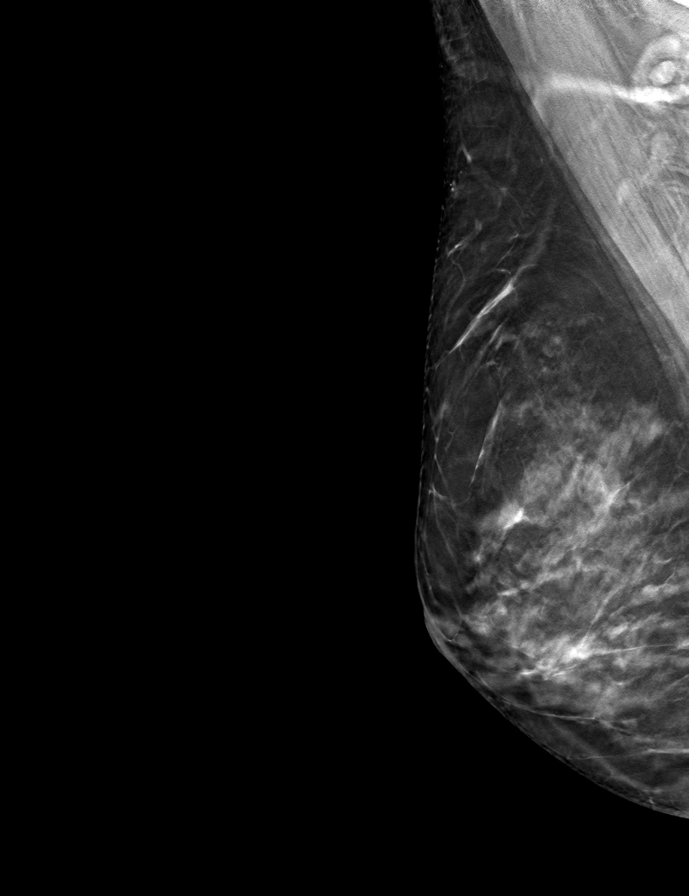

[L MLO tomo · tomo slice 31/61.0]
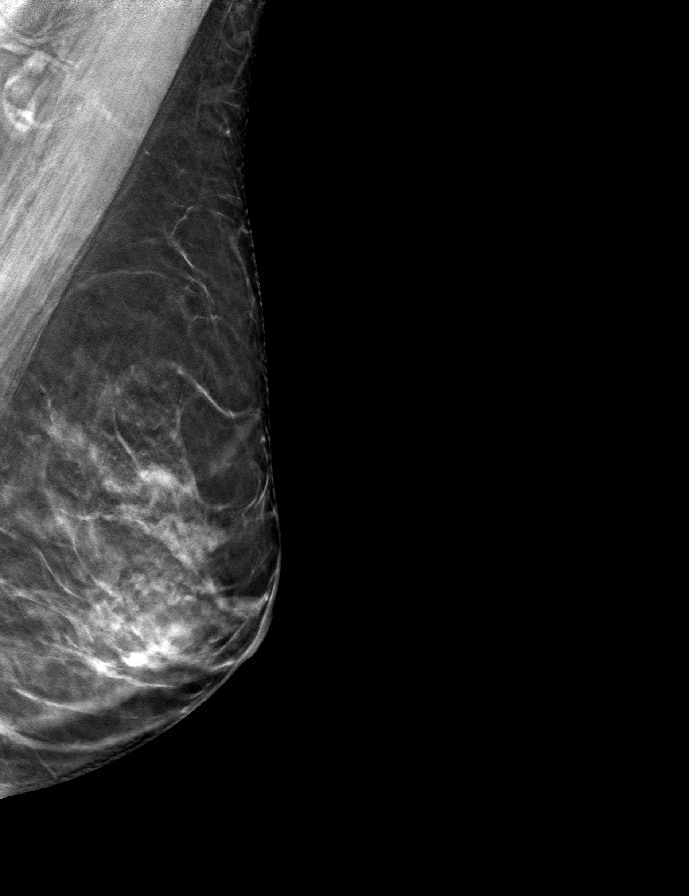

[9 of 24 positions shown; findings below may reference images not displayed]

ACR Breast Density Category c: The breast tissue is heterogeneously
dense, which may obscure small masses.
FINDINGS: There are no findings suspicious for malignancy. Images were
processed with CAD.
IMPRESSION: No mammographic evidence of malignancy. A result letter of this
screening mammogram will be mailed directly to the patient.

RECOMMENDATION:
Screening mammogram in one year. (Code:FT-U-LHB)

BI-RADS CATEGORY  1: Negative.

## 2019-08-01 DIAGNOSIS — F304 Manic episode in full remission: Secondary | ICD-10-CM | POA: Diagnosis not present

## 2019-08-15 DIAGNOSIS — F304 Manic episode in full remission: Secondary | ICD-10-CM | POA: Diagnosis not present

## 2019-08-29 DIAGNOSIS — F304 Manic episode in full remission: Secondary | ICD-10-CM | POA: Diagnosis not present

## 2019-09-19 DIAGNOSIS — E782 Mixed hyperlipidemia: Secondary | ICD-10-CM | POA: Diagnosis not present

## 2019-09-19 DIAGNOSIS — F419 Anxiety disorder, unspecified: Secondary | ICD-10-CM | POA: Diagnosis not present

## 2019-09-19 DIAGNOSIS — N952 Postmenopausal atrophic vaginitis: Secondary | ICD-10-CM | POA: Diagnosis not present

## 2019-09-19 DIAGNOSIS — Z6823 Body mass index (BMI) 23.0-23.9, adult: Secondary | ICD-10-CM | POA: Diagnosis not present

## 2019-09-19 DIAGNOSIS — F329 Major depressive disorder, single episode, unspecified: Secondary | ICD-10-CM | POA: Diagnosis not present

## 2019-09-19 DIAGNOSIS — Z23 Encounter for immunization: Secondary | ICD-10-CM | POA: Diagnosis not present

## 2019-09-19 DIAGNOSIS — Z0000 Encounter for general adult medical examination without abnormal findings: Secondary | ICD-10-CM | POA: Diagnosis not present

## 2019-10-10 DIAGNOSIS — F304 Manic episode in full remission: Secondary | ICD-10-CM | POA: Diagnosis not present

## 2019-11-11 DIAGNOSIS — R040 Epistaxis: Secondary | ICD-10-CM | POA: Diagnosis not present

## 2019-11-11 DIAGNOSIS — Z6824 Body mass index (BMI) 24.0-24.9, adult: Secondary | ICD-10-CM | POA: Diagnosis not present

## 2019-12-19 DIAGNOSIS — Z6824 Body mass index (BMI) 24.0-24.9, adult: Secondary | ICD-10-CM | POA: Diagnosis not present

## 2019-12-19 DIAGNOSIS — F419 Anxiety disorder, unspecified: Secondary | ICD-10-CM | POA: Diagnosis not present

## 2019-12-19 DIAGNOSIS — R040 Epistaxis: Secondary | ICD-10-CM | POA: Diagnosis not present

## 2019-12-19 DIAGNOSIS — Z23 Encounter for immunization: Secondary | ICD-10-CM | POA: Diagnosis not present

## 2019-12-19 DIAGNOSIS — E782 Mixed hyperlipidemia: Secondary | ICD-10-CM | POA: Diagnosis not present

## 2020-01-20 DIAGNOSIS — J3089 Other allergic rhinitis: Secondary | ICD-10-CM | POA: Diagnosis not present

## 2020-01-20 DIAGNOSIS — R040 Epistaxis: Secondary | ICD-10-CM | POA: Diagnosis not present

## 2020-01-30 DIAGNOSIS — Z8042 Family history of malignant neoplasm of prostate: Secondary | ICD-10-CM | POA: Diagnosis not present

## 2020-02-22 DIAGNOSIS — Z20828 Contact with and (suspected) exposure to other viral communicable diseases: Secondary | ICD-10-CM | POA: Diagnosis not present

## 2020-03-16 DIAGNOSIS — R040 Epistaxis: Secondary | ICD-10-CM | POA: Diagnosis not present

## 2020-03-16 DIAGNOSIS — U071 COVID-19: Secondary | ICD-10-CM | POA: Diagnosis not present

## 2020-04-09 DIAGNOSIS — Z5181 Encounter for therapeutic drug level monitoring: Secondary | ICD-10-CM | POA: Diagnosis not present

## 2020-04-09 DIAGNOSIS — L603 Nail dystrophy: Secondary | ICD-10-CM | POA: Diagnosis not present

## 2020-08-05 ENCOUNTER — Other Ambulatory Visit: Payer: Self-pay | Admitting: Physician Assistant

## 2020-08-05 DIAGNOSIS — Z1231 Encounter for screening mammogram for malignant neoplasm of breast: Secondary | ICD-10-CM

## 2020-09-08 ENCOUNTER — Ambulatory Visit: Payer: BC Managed Care – PPO

## 2020-09-10 ENCOUNTER — Other Ambulatory Visit: Payer: Self-pay

## 2020-09-10 ENCOUNTER — Ambulatory Visit
Admission: RE | Admit: 2020-09-10 | Discharge: 2020-09-10 | Disposition: A | Discharge status: Home / Self Care | Payer: No Typology Code available for payment source | Source: Ambulatory Visit | Attending: Physician Assistant | Admitting: Physician Assistant

## 2020-09-10 DIAGNOSIS — Z1231 Encounter for screening mammogram for malignant neoplasm of breast: Secondary | ICD-10-CM

## 2020-09-14 ENCOUNTER — Other Ambulatory Visit (HOSPITAL_COMMUNITY): Payer: Self-pay | Admitting: Family Medicine

## 2020-10-26 ENCOUNTER — Other Ambulatory Visit: Payer: Self-pay

## 2020-10-26 ENCOUNTER — Ambulatory Visit (INDEPENDENT_AMBULATORY_CARE_PROVIDER_SITE_OTHER): Payer: No Typology Code available for payment source | Admitting: Dermatology

## 2020-10-26 ENCOUNTER — Encounter: Payer: Self-pay | Admitting: Dermatology

## 2020-10-26 DIAGNOSIS — L821 Other seborrheic keratosis: Secondary | ICD-10-CM

## 2020-10-26 DIAGNOSIS — D1801 Hemangioma of skin and subcutaneous tissue: Secondary | ICD-10-CM

## 2020-10-26 DIAGNOSIS — Z1283 Encounter for screening for malignant neoplasm of skin: Secondary | ICD-10-CM

## 2020-10-26 DIAGNOSIS — L738 Other specified follicular disorders: Secondary | ICD-10-CM

## 2020-10-26 DIAGNOSIS — B351 Tinea unguium: Secondary | ICD-10-CM

## 2020-10-26 DIAGNOSIS — L308 Other specified dermatitis: Secondary | ICD-10-CM | POA: Diagnosis not present

## 2020-10-26 MED ORDER — TRIAMCINOLONE ACETONIDE 0.1 % EX OINT: 1.0000 "application " | TOPICAL_OINTMENT | Freq: Every day | CUTANEOUS | 3 refills | Status: AC

## 2020-10-28 ENCOUNTER — Encounter: Payer: Self-pay | Admitting: Dermatology

## 2020-10-28 NOTE — Progress Notes (Signed)
   New Patient   Subjective  Amy Irwin is a 59 y.o. female who presents for the following: Annual Exam (right abdomen-  "another dermatologist is watching", fungus on toes that she cant get rid of- tx- terbinafine - atleast 4 times ).  Toenail fungus Location:  Duration:  Quality:  Associated Signs/Symptoms: Modifying Factors:  Severity:  Timing: Context: Would like general skin check   The following portions of the chart were reviewed this encounter and updated as appropriate: Tobacco  Allergies  Meds  Problems  Med Hx  Surg Hx  Fam Hx      Objective  Well appearing patient in no apparent distress; mood and affect are within normal limits.  A full examination was performed including scalp, head, eyes, ears, nose, lips, neck, chest, axillae, abdomen, back, buttocks, bilateral upper extremities, bilateral lower extremities, hands, feet, fingers, toes, fingernails, and toenails. All findings within normal limits unless otherwise noted below.   Assessment & Plan  Screening for malignant neoplasm of skin Mid Back  Yearly skin exams.   Eczema craquele (2) Left Lower Leg - Anterior; Right Lower Leg - Anterior  If Curel or CeraVe cream and elimination of the space heater does not result in clearance, she has a prescription for triamcinolone ointment available.  triamcinolone ointment (KENALOG) 0.1 % - Left Lower Leg - Anterior, Right Lower Leg - Anterior  Hemangioma of skin Chest - Medial (Center)  Benign no treatment needed.  Sebaceous hyperplasia of face Mid Forehead  Benign no treatment needed.   Seborrheic keratosis (2) Mid Back; Left Supraclavicular Area  Benign no treatment needed.  Nail fungus (2) Left Foot - Anterior; Right Foot - Anterior  Has a history of taking terbinafine for treatment x 4 times with no help.  I discussed essentially all treatment choices with Neoma Laming including topical therapies, laser, long-term benefit and oral therapy  with fluconazole or itraconazole, and benign neglect.  No new intervention initiated.

## 2020-10-30 MED FILL — DULoxetine HCL 30 MG CPEP: 30 | 30 days supply | Qty: 90 | Fill #0

## 2020-12-09 ENCOUNTER — Other Ambulatory Visit (HOSPITAL_COMMUNITY): Payer: Self-pay | Admitting: Physician Assistant

## 2020-12-09 MED FILL — DULoxetine HCL 30 MG CPEP: 30 | 90 days supply | Qty: 270 | Fill #0

## 2020-12-09 MED FILL — AMOX-CLAV 875-125 MG TABLET: 875-125 | 7 days supply | Qty: 14 | Fill #0

## 2020-12-22 ENCOUNTER — Other Ambulatory Visit (HOSPITAL_COMMUNITY): Payer: Self-pay | Admitting: Physician Assistant

## 2020-12-22 MED FILL — LEVOCETIRIZINE 5 MG TABLET: 5 | 90 days supply | Qty: 90 | Fill #0

## 2021-02-18 ENCOUNTER — Other Ambulatory Visit (HOSPITAL_BASED_OUTPATIENT_CLINIC_OR_DEPARTMENT_OTHER): Payer: Self-pay

## 2021-03-08 ENCOUNTER — Encounter (HOSPITAL_COMMUNITY): Payer: Self-pay | Admitting: Physical Therapy

## 2021-03-08 ENCOUNTER — Other Ambulatory Visit: Payer: Self-pay

## 2021-03-08 ENCOUNTER — Ambulatory Visit (HOSPITAL_COMMUNITY): Payer: Self-pay | Attending: Occupational Medicine | Admitting: Physical Therapy

## 2021-03-08 DIAGNOSIS — M6281 Muscle weakness (generalized): Secondary | ICD-10-CM | POA: Insufficient documentation

## 2021-03-08 DIAGNOSIS — R2689 Other abnormalities of gait and mobility: Secondary | ICD-10-CM | POA: Insufficient documentation

## 2021-03-08 DIAGNOSIS — R29898 Other symptoms and signs involving the musculoskeletal system: Secondary | ICD-10-CM | POA: Insufficient documentation

## 2021-03-08 DIAGNOSIS — M542 Cervicalgia: Secondary | ICD-10-CM | POA: Insufficient documentation

## 2021-03-08 DIAGNOSIS — M545 Low back pain, unspecified: Secondary | ICD-10-CM | POA: Insufficient documentation

## 2021-03-08 NOTE — Therapy (Signed)
[place], [place], [address] Phone: [phone]   Fax:  [phone]  Physical Therapy Evaluation  Patient Details  Name: Amy Irwin MRN: [id number] Date of Birth: 1961-08-11 Referring Provider (PT): Dannial Monarch MD   Encounter Date: 03/08/2021   PT End of Session - 03/08/21 0915    Visit Number 1    Number of Visits 12    Date for PT Re-Evaluation 04/19/21    Authorization Type Workers comp (12 visits approved, precert req after 18EX visit)    Authorization - Visit Number 1    Authorization - Number of Visits 12    PT Start Time 0915    PT Stop Time 0950    PT Time Calculation (min) 35 min    Activity Tolerance Patient tolerated treatment well    Behavior During Therapy [facility] for tasks assessed/performed           Past Medical History:  Diagnosis Date  . Complication of anesthesia    states is hard to wake up post-op  . Deviated nasal septum 08/2015  . History of Graves' disease    no current problem or med.  . Nasal turbinate hypertrophy 08/2015    Past Surgical History:  Procedure Laterality Date  . ABDOMINAL HYSTERECTOMY  2014   partial  . COLONOSCOPY N/A 01/31/2018   Procedure: COLONOSCOPY;  Surgeon: Rogene Houston, MD;  [facility];  Service: Endoscopy;  Laterality: N/A;  830  . ENDOMETRIAL ABLATION    . PILONIDAL CYST EXCISION    . SEPTOPLASTY N/A 09/29/2015   Procedure: SEPTOPLASTY;  Surgeon: Leta Baptist, MD;  Location: [place];  Service: ENT;  Laterality: N/A;  . TUBAL LIGATION    . TURBINATE REDUCTION Bilateral 09/29/2015   Procedure: BILATERAL TURBINATE REDUCTION;  Surgeon: Leta Baptist, MD;  Location: [place];  Service: ENT;  Laterality: Bilateral;    There were no vitals filed for this visit.    Subjective Assessment - 03/08/21 0916    Subjective Patient is a 60 y.o. female who presents to physical therapy with c/o low back pain/strain. She  was sitting on a stool and it slid out from under her. It happened on March 16th. Symptoms have been getting better but its still there. She takes Flexeril at night which helps. Bending/sitting tend to take things worse. She also takes Tylenol which helps. She is on light duty at work which is all sitting which makes things worse. Her main goal is get rid of the strain. She is also having neck pain. Back symptoms improve with some movement.    Limitations Sitting;Lifting;House hold activities    Patient Stated Goals get rid of strain    Currently in Pain? Yes    Pain Score 4     Pain Location Back    Pain Orientation Lower    Pain Descriptors / Indicators Dull    Pain Type Acute pain    Pain Onset More than a month ago    Pain Frequency Constant              [facility] PT Assessment - 03/08/21 0001      Assessment   Medical Diagnosis low back strain, neck strain    Referring Provider (PT) Dannial Monarch MD    Onset Date/Surgical Date 02/10/21    Next MD Visit Thursday    Prior Therapy for shoulder      Precautions  Precautions None      Restrictions   Weight Bearing Restrictions No      Balance Screen   Has the patient fallen in the past 6 months Yes    How many times? 2    Has the patient had a decrease in activity level because of a fear of falling?  No    Is the patient reluctant to leave their home because of a fear of falling?  No      Prior Function   Level of Independence Independent    Vocation Full time employment    Vocation Requirements LPN urology      Cognition   Overall Cognitive Status Within Functional Limits for tasks assessed      Observation/Other Assessments   Observations Ambulates without AD    Focus on Therapeutic Outcomes (FOTO)  not uploaded - complete next session      Sensation   Light Touch Appears Intact      ROM / Strength   AROM / PROM / Strength AROM;Strength      AROM   AROM Assessment Site Lumbar    Lumbar Flexion 25% limited     Lumbar Extension 25% limited, pain in R low back    Lumbar - Right Side Bend 25% limited, pulling    Lumbar - Left Side Bend 25% limited, pulling    Lumbar - Right Rotation 25% limited, pulling in shoulder R    Lumbar - Left Rotation 25% limited, pulling in shoulder R      Strength   Overall Strength Comments pain in low back with R hip flex and bilateral hip abd/ ext    Strength Assessment Site Hip;Knee;Ankle    Right/Left Hip Right;Left    Right Hip Flexion 4/5    Right Hip Extension 4-/5    Right Hip ABduction 4-/5    Left Hip Flexion 4+/5    Left Hip Extension 4-/5    Left Hip ABduction 4-/5    Right/Left Knee Right;Left    Right Knee Flexion 5/5    Right Knee Extension 5/5    Left Knee Flexion 5/5    Left Knee Extension 5/5    Right/Left Ankle Right;Left    Right Ankle Dorsiflexion 5/5    Left Ankle Dorsiflexion 5/5      Palpation   Spinal mobility grossly hypomobile lumbar and thoracic with greatest pain L2-L5 CPA    Palpation comment TTP R paraspinals, QL, glutes; tender and hyperactive in R musculature;  not tender L side      Ambulation/Gait   Ambulation/Gait Yes    Ambulation/Gait Assistance 7: Independent    Ambulation Distance (Feet) 150 Feet    Gait Pattern Within Functional Limits                      Objective measurements completed on examination: See above findings.       [facility] Adult PT Treatment/Exercise - 03/08/21 0001      Exercises   Exercises Lumbar      Lumbar Exercises: Stretches   Other Lumbar Stretch Exercise seated lateral streth overhead 5x 10 second holds      Lumbar Exercises: Supine   Bridge 10 reps    Bridge Limitations cueing for glute activation                  PT Education - 03/08/21 0914    Education Details Patient educated on exam findings, POC, scope of PT, exercise mechanics,  HEP    Person(s) Educated Patient    Methods Explanation;Demonstration;Handout    Comprehension Verbalized  understanding;Returned demonstration            PT Short Term Goals - 03/08/21 0957      PT SHORT TERM GOAL #1   Title Patient will be independent with HEP in order to improve functional outcomes.    Time 3    Period Weeks    Status New    Target Date 03/29/21      PT SHORT TERM GOAL #2   Title Patient will report at least 25% improvement in symptoms for improved quality of life.    Time 3    Period Weeks    Status New    Target Date 03/29/21             PT Long Term Goals - 03/08/21 0957      PT LONG TERM GOAL #1   Title Patient will report at least 75% improvement in symptoms for improved quality of life.    Time 6    Period Weeks    Status New    Target Date 04/19/21      PT LONG TERM GOAL #2   Title Patient will improve FOTO score by at least 10 points in order to indicate improved tolerance to activity.    Time 6    Period Weeks    Status New    Target Date 04/19/21      PT LONG TERM GOAL #3   Title Patient will demonstrate at least 25% improvement in lumbar ROM in all planes for improved ability to move head while at work.    Time 6    Period Weeks    Status New    Target Date 04/19/21      PT LONG TERM GOAL #4   Title Patient will demonstrate grade of 5/5 MMT grade in all tested musculature as evidence of improved strength to to assist with promoting hip stability for work.    Time 6    Period Weeks    Status New    Target Date 04/19/21                  Plan - 03/08/21 0953    Clinical Impression Statement Patient is a 60 y.o. female who presents to physical therapy with c/o low back and neck pain/strain following a fall from a chair at work on 02/10/21. She presents with pain limited deficits in lumbar and LE strength, ROM, endurance, postural impairments, spinal mobility, hyperactive and tender musculature, and functional mobility with ADL. She is having to modify and restrict ADL as indicated by subjective information and objective measures  which is affecting overall participation. Patient will benefit from skilled physical therapy in order to improve function and reduce impairment.    Personal Factors and Comorbidities Fitness;Behavior Pattern    Examination-Activity Limitations Sit;Lift;Reach Overhead;Caring for Others    Examination-Participation Restrictions Cleaning;Occupation;Community Activity;Yard Work;Volunteer;Shop;Meal Prep    Stability/Clinical Decision Making Stable/Uncomplicated    Clinical Decision Making Low    Rehab Potential Good    PT Frequency 2x / week    PT Duration 6 weeks    PT Treatment/Interventions ADLs/Self Care Home Management;Cryotherapy;Electrical Stimulation;Aquatic Therapy;Iontophoresis 4mg /ml Dexamethasone;Traction;Moist Heat;Ultrasound;DME Instruction;Gait training;Stair training;Functional mobility training;Therapeutic activities;Therapeutic exercise;Balance training;Neuromuscular re-education;Patient/family education;Orthotic Fit/Training;Manual techniques;Dry needling;Energy conservation;Splinting;Taping;Spinal Manipulations;Joint Manipulations    PT Next Visit Plan f/u with HEP, complete FOTO as it was not uploaded, begin core exercise and lumbar mobility,  thoracic mobility, possibly manual for pain/mobility, further assess neck as able    PT Home Exercise Plan 4/11 bridge, lateral overhead stretch    Consulted and Agree with Plan of Care Patient           Patient will benefit from skilled therapeutic intervention in order to improve the following deficits and impairments:  Decreased mobility,Decreased strength,Postural dysfunction,Impaired flexibility,Improper body mechanics,Pain,Hypomobility,Increased muscle spasms,Impaired UE functional use,Decreased activity tolerance,Decreased range of motion  Visit Diagnosis: Low back pain, unspecified back pain laterality, unspecified chronicity, unspecified whether sciatica present  Cervicalgia  Muscle weakness (generalized)  Other abnormalities  of gait and mobility  Other symptoms and signs involving the musculoskeletal system     Problem List Patient Active Problem List   Diagnosis Date Noted  . Special screening for malignant neoplasms, colon 03/29/2017  . BILIARY DYSKINESIA 07/13/2010  . ABDOMINAL PAIN, RECURRENT 07/13/2010    10:01 AM, 03/08/21 Mearl Latin PT, DPT Physical Therapist at [place], [place], [address] Phone: [phone]   Fax:  [phone]  Name: Amy Irwin MRN: [id number] Date of Birth: 1961-10-01

## 2021-03-08 NOTE — Patient Instructions (Signed)
Access Code: [email or web address] URL: [email or web address] Date: 03/08/2021 Prepared by: Mitzi Hansen Sherel Fennell  Exercises Supine Bridge - 1-2 x daily - 7 x weekly - 1-2 sets - 10 reps Seated Alternating Side Stretch with Arm Overhead - 5 x daily - 7 x weekly - 5 reps - 10 second hold

## 2021-03-11 ENCOUNTER — Ambulatory Visit (HOSPITAL_COMMUNITY): Payer: Self-pay | Admitting: Physical Therapy

## 2021-03-12 ENCOUNTER — Ambulatory Visit (HOSPITAL_COMMUNITY): Payer: Self-pay | Admitting: Physical Therapy

## 2021-03-12 ENCOUNTER — Other Ambulatory Visit: Payer: Self-pay

## 2021-03-12 DIAGNOSIS — R2689 Other abnormalities of gait and mobility: Secondary | ICD-10-CM

## 2021-03-12 DIAGNOSIS — R29898 Other symptoms and signs involving the musculoskeletal system: Secondary | ICD-10-CM

## 2021-03-12 DIAGNOSIS — M545 Low back pain, unspecified: Secondary | ICD-10-CM

## 2021-03-12 DIAGNOSIS — M542 Cervicalgia: Secondary | ICD-10-CM

## 2021-03-12 DIAGNOSIS — M6281 Muscle weakness (generalized): Secondary | ICD-10-CM

## 2021-03-12 NOTE — Therapy (Signed)
[place], [place], [address] Phone: [phone]   Fax:  [phone]  Physical Therapy Treatment  Patient Details  Name: Amy Irwin MRN: [id number] Date of Birth: 07-07-1961 Referring Provider (PT): Dannial Monarch MD   Encounter Date: 03/12/2021  PHYSICAL THERAPY DISCHARGE SUMMARY  Visits from Start of Care: 2  Current functional level related to goals / functional outcomes: I no pain    Remaining deficits: none   Education / Equipment: HEP Plan: Patient agrees to discharge.  Patient goals were not met. Patient is being discharged due to meeting the stated rehab goals.  ?????        PT End of Session - 03/12/21 1629    Visit Number 2    Number of Visits 2    Date for PT Re-Evaluation 04/19/21    Authorization Type Workers comp (12 visits approved, precert req after 63JS visit)    Authorization - Visit Number 2    Authorization - Number of Visits 2    PT Start Time 1615    PT Stop Time 1628    PT Time Calculation (min) 13 min    Activity Tolerance Patient tolerated treatment well    Behavior During Therapy WFL for tasks assessed/performed           Past Medical History:  Diagnosis Date  . Complication of anesthesia    states is hard to wake up post-op  . Deviated nasal septum 08/2015  . History of Graves' disease    no current problem or med.  . Nasal turbinate hypertrophy 08/2015    Past Surgical History:  Procedure Laterality Date  . ABDOMINAL HYSTERECTOMY  2014   partial  . COLONOSCOPY N/A 01/31/2018   Procedure: COLONOSCOPY;  Surgeon: Rogene Houston, MD;  [facility];  Service: Endoscopy;  Laterality: N/A;  830  . ENDOMETRIAL ABLATION    . PILONIDAL CYST EXCISION    . SEPTOPLASTY N/A 09/29/2015   Procedure: SEPTOPLASTY;  Surgeon: Leta Baptist, MD;  Location: [place];  Service: ENT;  Laterality: N/A;  . TUBAL LIGATION    . TURBINATE REDUCTION Bilateral  09/29/2015   Procedure: BILATERAL TURBINATE REDUCTION;  Surgeon: Leta Baptist, MD;  Location: [place];  Service: ENT;  Laterality: Bilateral;    There were no vitals filed for this visit.   Subjective Assessment - 03/12/21 1615    Subjective Pt states that she went to the MD who released her and stated that she did not have to continue with therapy.  She is pain free at this time she has no idea what was done on Monday but it helped and she has been doing her exercise.    Limitations Sitting;Lifting;House hold activities    Patient Stated Goals get rid of strain    Pain Onset More than a month ago              [facility] PT Assessment - 03/12/21 0001      Assessment   Medical Diagnosis low back strain, neck strain    Referring Provider (PT) Dannial Monarch MD    Onset Date/Surgical Date 02/10/21    Next MD Visit Thursday    Prior Therapy for shoulder      Precautions   Precautions None      Restrictions   Weight Bearing Restrictions No      Prior Function   Level of Independence Independent    Vocation  Full time employment    Contractor urology      Cognition   Overall Cognitive Status Within Functional Limits for tasks assessed      Observation/Other Assessments   Observations Ambulates without AD    Focus on Therapeutic Outcomes (FOTO)  not uploaded - complete next session      Sensation   Light Touch Appears Intact      AROM   Lumbar Flexion wfl    Lumbar Extension wf;    Lumbar - Right Side Bend wf;    Lumbar - Left Side Bend wf;    Lumbar - Right Rotation wfl    Lumbar - Left Rotation wfl      Strength   Overall Strength Comments pain in low back with R hip flex and bilateral hip abd/ ext    Right Hip Flexion 5/5    Right Hip Extension 5/5    Right Hip ABduction 5/5    Left Hip Flexion 5/5    Left Hip Extension 5/5    Left Hip ABduction 5/5    Right Knee Flexion 5/5    Right Knee Extension 5/5    Left Knee Flexion 5/5    Left Knee  Extension 5/5    Right Ankle Dorsiflexion 5/5    Left Ankle Dorsiflexion 5/5                         [facility] Adult PT Treatment/Exercise - 03/12/21 0001      Ambulation/Gait   Ambulation/Gait --    Ambulation/Gait Assistance --    Ambulation Distance (Feet) --    Gait Pattern --      Lumbar Exercises: Standing   Other Standing Lumbar Exercises hip excursion x 3      Lumbar Exercises: Seated   Other Seated Lumbar Exercises thoracic and cervical excursion x 3                    PT Short Term Goals - 03/12/21 1626      PT SHORT TERM GOAL #1   Title Patient will be independent with HEP in order to improve functional outcomes.    Time 3    Status Achieved      PT SHORT TERM GOAL #2   Title Patient will report at least 25% improvement in symptoms for improved quality of life.    Time 3    Period Weeks    Status Achieved    Target Date 03/29/21             PT Long Term Goals - 03/12/21 1626      PT LONG TERM GOAL #1   Title Patient will report at least 75% improvement in symptoms for improved quality of life.    Time 6    Period Weeks    Status Achieved      PT LONG TERM GOAL #2   Title Patient will improve FOTO score by at least 10 points in order to indicate improved tolerance to activity.    Time 6    Period Weeks    Status Unable to assess   did not complete     PT LONG TERM GOAL #3   Title Patient will demonstrate at least 25% improvement in lumbar ROM in all planes for improved ability to move head while at work.    Time 6    Period Weeks    Status Achieved  PT LONG TERM GOAL #4   Title Patient will demonstrate grade of 5/5 MMT grade in all tested musculature as evidence of improved strength to to assist with promoting hip stability for work.    Time 6    Period Weeks    Status Achieved                 Plan - 03/12/21 1630    Clinical Impression Statement Pt states that she is not sure what was done on Monday but  she woke up painfree.  She has not had any pain since.  She went to the MD yesterday who released her and told her she could come to this visit but may not need to come to any more therapy sessions if she is doing well. Pt desires to stop therapy.    Personal Factors and Comorbidities Fitness;Behavior Pattern    Examination-Activity Limitations Sit;Lift;Reach Overhead;Caring for Others    Examination-Participation Restrictions Cleaning;Occupation;Community Activity;Yard Work;Volunteer;Shop;Meal Prep    Stability/Clinical Decision Making Stable/Uncomplicated    Rehab Potential Good    PT Frequency 2x / week    PT Duration 6 weeks    PT Treatment/Interventions ADLs/Self Care Home Management;Cryotherapy;Electrical Stimulation;Aquatic Therapy;Iontophoresis 54m/ml Dexamethasone;Traction;Moist Heat;Ultrasound;DME Instruction;Gait training;Stair training;Functional mobility training;Therapeutic activities;Therapeutic exercise;Balance training;Neuromuscular re-education;Patient/family education;Orthotic Fit/Training;Manual techniques;Dry needling;Energy conservation;Splinting;Taping;Spinal Manipulations;Joint Manipulations    PT Next Visit Plan discharge.    PT Home Exercise Plan 4/11 bridge, lateral overhead stretch4/15: hip, thoracic and cervical excursion exercises    Consulted and Agree with Plan of Care Patient           Patient will benefit from skilled therapeutic intervention in order to improve the following deficits and impairments:  Decreased mobility,Decreased strength,Postural dysfunction,Impaired flexibility,Improper body mechanics,Pain,Hypomobility,Increased muscle spasms,Impaired UE functional use,Decreased activity tolerance,Decreased range of motion  Visit Diagnosis: Low back pain, unspecified back pain laterality, unspecified chronicity, unspecified whether sciatica present  Cervicalgia  Muscle weakness (generalized)  Other abnormalities of gait and mobility  Other symptoms and  signs involving the musculoskeletal system     Problem List Patient Active Problem List   Diagnosis Date Noted  . Special screening for malignant neoplasms, colon 03/29/2017  . BILIARY DYSKINESIA 07/13/2010  . ABDOMINAL PAIN, RECURRENT 07/13/2010   CRayetta Humphrey PT CLT [phone]/15/2022, 4:32 PM  [id number] [address]Phone: [phone]  Fax:  [phone] Name: Amy TRIVEDIMRN: [id number] of Birth: 307-Mar-1962

## 2021-03-15 MED FILL — Duloxetine HCl Enteric Coated Pellets Cap 30 MG (Base Eq): ORAL | 90 days supply | Qty: 270 | Fill #0 | Status: AC

## 2021-03-16 ENCOUNTER — Other Ambulatory Visit (HOSPITAL_COMMUNITY): Payer: Self-pay

## 2021-03-17 ENCOUNTER — Encounter (HOSPITAL_COMMUNITY): Payer: No Typology Code available for payment source

## 2021-03-17 ENCOUNTER — Other Ambulatory Visit (HOSPITAL_COMMUNITY): Payer: Self-pay

## 2021-03-19 ENCOUNTER — Encounter (HOSPITAL_COMMUNITY): Payer: No Typology Code available for payment source

## 2021-03-22 ENCOUNTER — Encounter (HOSPITAL_COMMUNITY): Payer: No Typology Code available for payment source | Admitting: Physical Therapy

## 2021-03-24 ENCOUNTER — Encounter (HOSPITAL_COMMUNITY): Payer: No Typology Code available for payment source

## 2021-03-25 ENCOUNTER — Other Ambulatory Visit (HOSPITAL_COMMUNITY): Payer: Self-pay

## 2021-03-30 ENCOUNTER — Ambulatory Visit (HOSPITAL_COMMUNITY): Payer: Self-pay

## 2021-04-01 ENCOUNTER — Encounter (HOSPITAL_COMMUNITY): Payer: No Typology Code available for payment source | Admitting: Physical Therapy

## 2021-04-06 ENCOUNTER — Encounter (HOSPITAL_COMMUNITY): Payer: No Typology Code available for payment source | Admitting: Physical Therapy

## 2021-04-08 ENCOUNTER — Encounter (HOSPITAL_COMMUNITY): Payer: No Typology Code available for payment source | Admitting: Physical Therapy

## 2021-04-13 ENCOUNTER — Encounter (HOSPITAL_COMMUNITY): Payer: No Typology Code available for payment source | Admitting: Physical Therapy

## 2021-04-15 ENCOUNTER — Encounter (HOSPITAL_COMMUNITY): Payer: No Typology Code available for payment source | Admitting: Physical Therapy

## 2021-08-11 ENCOUNTER — Other Ambulatory Visit: Payer: Self-pay | Admitting: Physician Assistant

## 2021-08-11 DIAGNOSIS — R928 Other abnormal and inconclusive findings on diagnostic imaging of breast: Secondary | ICD-10-CM

## 2021-09-13 ENCOUNTER — Other Ambulatory Visit: Payer: Self-pay

## 2021-09-13 ENCOUNTER — Ambulatory Visit
Admission: RE | Admit: 2021-09-13 | Discharge: 2021-09-13 | Disposition: A | Discharge status: Home / Self Care | Payer: BC Managed Care – PPO | Source: Ambulatory Visit | Attending: Physician Assistant | Admitting: Physician Assistant

## 2021-09-13 DIAGNOSIS — R928 Other abnormal and inconclusive findings on diagnostic imaging of breast: Secondary | ICD-10-CM

## 2021-11-01 ENCOUNTER — Ambulatory Visit: Payer: No Typology Code available for payment source | Admitting: Dermatology

## 2021-12-31 ENCOUNTER — Other Ambulatory Visit (HOSPITAL_COMMUNITY): Payer: Self-pay | Admitting: Orthopedic Surgery

## 2021-12-31 DIAGNOSIS — M25562 Pain in left knee: Secondary | ICD-10-CM

## 2022-01-11 ENCOUNTER — Ambulatory Visit (HOSPITAL_COMMUNITY)
Admission: RE | Admit: 2022-01-11 | Discharge: 2022-01-11 | Disposition: A | Discharge status: Home / Self Care | Payer: BC Managed Care – PPO | Source: Ambulatory Visit | Attending: Orthopedic Surgery | Admitting: Orthopedic Surgery

## 2022-01-11 ENCOUNTER — Other Ambulatory Visit: Payer: Self-pay

## 2022-01-11 DIAGNOSIS — M25562 Pain in left knee: Secondary | ICD-10-CM | POA: Diagnosis not present

## 2022-01-14 ENCOUNTER — Ambulatory Visit (HOSPITAL_COMMUNITY): Payer: BC Managed Care – PPO

## 2022-02-09 ENCOUNTER — Ambulatory Visit: Payer: BC Managed Care – PPO | Admitting: Dermatology

## 2022-05-04 ENCOUNTER — Ambulatory Visit: Payer: BC Managed Care – PPO | Admitting: Dermatology

## 2022-08-17 ENCOUNTER — Other Ambulatory Visit: Payer: Self-pay | Admitting: Physician Assistant

## 2022-08-17 DIAGNOSIS — Z1231 Encounter for screening mammogram for malignant neoplasm of breast: Secondary | ICD-10-CM

## 2022-09-16 ENCOUNTER — Ambulatory Visit
Admission: RE | Admit: 2022-09-16 | Discharge: 2022-09-16 | Disposition: A | Discharge status: Home / Self Care | Payer: No Typology Code available for payment source | Source: Ambulatory Visit | Attending: Physician Assistant | Admitting: Physician Assistant

## 2022-09-16 DIAGNOSIS — Z1231 Encounter for screening mammogram for malignant neoplasm of breast: Secondary | ICD-10-CM

## 2023-08-15 ENCOUNTER — Other Ambulatory Visit: Payer: Self-pay | Admitting: Physician Assistant

## 2023-08-15 DIAGNOSIS — Z1231 Encounter for screening mammogram for malignant neoplasm of breast: Secondary | ICD-10-CM

## 2023-09-22 ENCOUNTER — Ambulatory Visit: Payer: No Typology Code available for payment source

## 2023-10-06 ENCOUNTER — Ambulatory Visit
Admission: RE | Admit: 2023-10-06 | Discharge: 2023-10-06 | Disposition: A | Discharge status: Home / Self Care | Payer: No Typology Code available for payment source | Source: Ambulatory Visit | Attending: Physician Assistant | Admitting: Physician Assistant

## 2023-10-06 DIAGNOSIS — Z1231 Encounter for screening mammogram for malignant neoplasm of breast: Secondary | ICD-10-CM

## 2024-09-17 ENCOUNTER — Other Ambulatory Visit: Payer: Self-pay | Admitting: Physician Assistant

## 2024-09-17 DIAGNOSIS — Z1231 Encounter for screening mammogram for malignant neoplasm of breast: Secondary | ICD-10-CM

## 2024-10-11 ENCOUNTER — Ambulatory Visit
Admission: RE | Admit: 2024-10-11 | Discharge: 2024-10-11 | Disposition: A | Discharge status: Home / Self Care | Source: Ambulatory Visit | Attending: Physician Assistant | Admitting: Physician Assistant

## 2024-10-11 DIAGNOSIS — Z1231 Encounter for screening mammogram for malignant neoplasm of breast: Secondary | ICD-10-CM
